# Patient Record
Sex: Female | Born: 1965
Health system: Southern US, Community
[De-identification: ages and names within clinical notes are randomized; demographics above are authoritative.]

## PROBLEM LIST (undated history)

## (undated) DIAGNOSIS — L709 Acne, unspecified: Secondary | ICD-10-CM

## (undated) DIAGNOSIS — G4730 Sleep apnea, unspecified: Secondary | ICD-10-CM

## (undated) DIAGNOSIS — I5181 Takotsubo syndrome: Secondary | ICD-10-CM

## (undated) DIAGNOSIS — F32A Depression, unspecified: Secondary | ICD-10-CM

## (undated) DIAGNOSIS — I10 Essential (primary) hypertension: Secondary | ICD-10-CM

## (undated) DIAGNOSIS — F329 Major depressive disorder, single episode, unspecified: Secondary | ICD-10-CM

## (undated) DIAGNOSIS — F419 Anxiety disorder, unspecified: Secondary | ICD-10-CM

## (undated) DIAGNOSIS — K921 Melena: Secondary | ICD-10-CM

## (undated) DIAGNOSIS — M1612 Unilateral primary osteoarthritis, left hip: Secondary | ICD-10-CM

## (undated) HISTORY — DX: Melena: K92.1

---

## 2008-01-07 ENCOUNTER — Encounter: Admission: RE | Admit: 2008-01-07 | Discharge: 2008-01-07 | Payer: Self-pay | Admitting: Orthopedic Surgery

## 2014-09-21 HISTORY — PX: COLONOSCOPY: SHX5424

## 2016-02-25 HISTORY — PX: CYSTECTOMY: SUR359

## 2018-01-19 ENCOUNTER — Inpatient Hospital Stay (HOSPITAL_COMMUNITY)
Admission: AD | Admit: 2018-01-19 | Discharge: 2018-01-20 | DRG: 281 | Disposition: A | Discharge status: Home / Self Care | Payer: BC Managed Care – PPO | Source: Other Acute Inpatient Hospital | Attending: Cardiology | Admitting: Cardiology

## 2018-01-19 ENCOUNTER — Other Ambulatory Visit: Payer: Self-pay

## 2018-01-19 ENCOUNTER — Encounter (HOSPITAL_COMMUNITY): Payer: Self-pay

## 2018-01-19 ENCOUNTER — Encounter (HOSPITAL_COMMUNITY)
Admission: AD | Disposition: A | Discharge status: Home / Self Care | Payer: Self-pay | Source: Other Acute Inpatient Hospital | Attending: Cardiology

## 2018-01-19 ENCOUNTER — Inpatient Hospital Stay (HOSPITAL_COMMUNITY): Payer: BC Managed Care – PPO

## 2018-01-19 DIAGNOSIS — I2510 Atherosclerotic heart disease of native coronary artery without angina pectoris: Secondary | ICD-10-CM | POA: Diagnosis present

## 2018-01-19 DIAGNOSIS — F329 Major depressive disorder, single episode, unspecified: Secondary | ICD-10-CM | POA: Diagnosis present

## 2018-01-19 DIAGNOSIS — F32A Depression, unspecified: Secondary | ICD-10-CM

## 2018-01-19 DIAGNOSIS — Z792 Long term (current) use of antibiotics: Secondary | ICD-10-CM

## 2018-01-19 DIAGNOSIS — R778 Other specified abnormalities of plasma proteins: Secondary | ICD-10-CM | POA: Diagnosis present

## 2018-01-19 DIAGNOSIS — Z79899 Other long term (current) drug therapy: Secondary | ICD-10-CM | POA: Diagnosis not present

## 2018-01-19 DIAGNOSIS — F419 Anxiety disorder, unspecified: Secondary | ICD-10-CM | POA: Diagnosis present

## 2018-01-19 DIAGNOSIS — I214 Non-ST elevation (NSTEMI) myocardial infarction: Secondary | ICD-10-CM | POA: Diagnosis present

## 2018-01-19 DIAGNOSIS — R55 Syncope and collapse: Secondary | ICD-10-CM | POA: Diagnosis not present

## 2018-01-19 DIAGNOSIS — L709 Acne, unspecified: Secondary | ICD-10-CM | POA: Diagnosis present

## 2018-01-19 DIAGNOSIS — I5181 Takotsubo syndrome: Secondary | ICD-10-CM | POA: Diagnosis present

## 2018-01-19 DIAGNOSIS — R0789 Other chest pain: Secondary | ICD-10-CM | POA: Diagnosis present

## 2018-01-19 DIAGNOSIS — R7989 Other specified abnormal findings of blood chemistry: Secondary | ICD-10-CM | POA: Diagnosis present

## 2018-01-19 HISTORY — DX: Acne, unspecified: L70.9

## 2018-01-19 HISTORY — DX: Takotsubo syndrome: I51.81

## 2018-01-19 HISTORY — DX: Depression, unspecified: F32.A

## 2018-01-19 HISTORY — DX: Anxiety disorder, unspecified: F41.9

## 2018-01-19 HISTORY — DX: Major depressive disorder, single episode, unspecified: F32.9

## 2018-01-19 HISTORY — PX: LEFT HEART CATH AND CORONARY ANGIOGRAPHY: CATH118249

## 2018-01-19 LAB — COMPREHENSIVE METABOLIC PANEL
ALT: 22 U/L (ref 0–44)
AST: 26 U/L (ref 15–41)
Albumin: 4.4 g/dL (ref 3.5–5.0)
Alkaline Phosphatase: 39 U/L (ref 38–126)
Anion gap: 7 (ref 5–15)
BUN: 12 mg/dL (ref 6–20)
CO2: 24 mmol/L (ref 22–32)
Calcium: 9.2 mg/dL (ref 8.9–10.3)
Chloride: 107 mmol/L (ref 98–111)
Creatinine, Ser: 0.85 mg/dL (ref 0.44–1.00)
GFR calc Af Amer: >60 mL/min (ref >60)
GFR calc non Af Amer: >60 mL/min (ref >60)
Glucose, Bld: 102 mg/dL — ABNORMAL HIGH (ref 70–99)
Potassium: 4.1 mmol/L (ref 3.5–5.1)
Sodium: 138 mmol/L (ref 135–145)
Total Bilirubin: 0.8 mg/dL (ref 0.3–1.2)
Total Protein: 7.3 g/dL (ref 6.5–8.1)

## 2018-01-19 LAB — LIPID PANEL
CHOLESTEROL: 158 mg/dL (ref 0–200)
HDL: 56 mg/dL (ref >40)
LDL CALC: 94 mg/dL (ref 0–99)
Total CHOL/HDL Ratio: 2.8 RATIO
Triglycerides: 39 mg/dL (ref <150)
VLDL: 8 mg/dL (ref 0–40)

## 2018-01-19 LAB — ECHOCARDIOGRAM COMPLETE
Height: 62 in
Weight: 2328.06 oz

## 2018-01-19 LAB — TROPONIN I: Troponin I: 1.1 ng/mL (ref <0.03)

## 2018-01-19 LAB — CBC WITH DIFFERENTIAL/PLATELET
Abs Immature Granulocytes: 0.04 10*3/uL (ref 0.00–0.07)
Basophils Absolute: 0 10*3/uL (ref 0.0–0.1)
Basophils Relative: 0 %
Eosinophils Absolute: 0.1 10*3/uL (ref 0.0–0.5)
Eosinophils Relative: 1 %
HCT: 43.1 % (ref 36.0–46.0)
Hemoglobin: 14.1 g/dL (ref 12.0–15.0)
Immature Granulocytes: 0 %
Lymphocytes Relative: 15 %
Lymphs Abs: 1.5 10*3/uL (ref 0.7–4.0)
MCH: 29.3 pg (ref 26.0–34.0)
MCHC: 32.7 g/dL (ref 30.0–36.0)
MCV: 89.4 fL (ref 80.0–100.0)
Monocytes Absolute: 0.5 10*3/uL (ref 0.1–1.0)
Monocytes Relative: 5 %
Neutro Abs: 8 10*3/uL — ABNORMAL HIGH (ref 1.7–7.7)
Neutrophils Relative %: 79 %
Platelets: 359 10*3/uL (ref 150–400)
RBC: 4.82 MIL/uL (ref 3.87–5.11)
RDW: 11.9 % (ref 11.5–15.5)
WBC: 10.1 10*3/uL (ref 4.0–10.5)
nRBC: 0 % (ref 0.0–0.2)

## 2018-01-19 LAB — APTT: aPTT: 102 seconds — ABNORMAL HIGH (ref 24–36)

## 2018-01-19 LAB — HEMOGLOBIN A1C
Hgb A1c MFr Bld: 4.6 % — ABNORMAL LOW (ref 4.8–5.6)
Mean Plasma Glucose: 85.32 mg/dL

## 2018-01-19 LAB — HIV ANTIBODY (ROUTINE TESTING W REFLEX): HIV Screen 4th Generation wRfx: NONREACTIVE

## 2018-01-19 LAB — MRSA PCR SCREENING: MRSA by PCR: NEGATIVE

## 2018-01-19 LAB — PROTIME-INR
INR: 1.03
Prothrombin Time: 13.4 seconds (ref 11.4–15.2)

## 2018-01-19 SURGERY — LEFT HEART CATH AND CORONARY ANGIOGRAPHY
Anesthesia: LOCAL

## 2018-01-19 MED ORDER — SODIUM CHLORIDE 0.9 % WEIGHT BASED INFUSION: 1.0000 mL/kg/h | INTRAVENOUS | Status: DC

## 2018-01-19 MED ORDER — SODIUM CHLORIDE 0.9% FLUSH: 3.0000 mL | Freq: Two times a day (BID) | INTRAVENOUS | Status: DC

## 2018-01-19 MED ORDER — ATORVASTATIN CALCIUM 80 MG PO TABS
80.0000 mg | ORAL_TABLET | Freq: Every day | ORAL | Status: DC
  Administered 2018-01-19: 80 mg via ORAL
  Filled 2018-01-19: qty 1

## 2018-01-19 MED ORDER — HEPARIN SODIUM (PORCINE) 1000 UNIT/ML IJ SOLN
2000.00 | INTRAMUSCULAR | Status: DC
Start: ? — End: 2018-01-19

## 2018-01-19 MED ORDER — DOXYCYCLINE HYCLATE 100 MG PO TABS
100.0000 mg | ORAL_TABLET | Freq: Two times a day (BID) | ORAL | Status: DC
  Administered 2018-01-19 – 2018-01-20 (×2): 100 mg via ORAL
  Filled 2018-01-19 (×3): qty 1

## 2018-01-19 MED ORDER — HEPARIN (PORCINE) IN NACL 25000-0.45 UT/250ML-% IV SOLN
12.00 | INTRAVENOUS | Status: DC
Start: ? — End: 2018-01-19

## 2018-01-19 MED ORDER — CARVEDILOL 6.25 MG PO TABS
6.2500 mg | ORAL_TABLET | Freq: Two times a day (BID) | ORAL | Status: DC
  Administered 2018-01-19: 6.25 mg via ORAL
  Filled 2018-01-19: qty 1

## 2018-01-19 MED ORDER — FENTANYL CITRATE (PF) 100 MCG/2ML IJ SOLN
INTRAMUSCULAR | Status: DC | PRN
  Administered 2018-01-19: 25 ug via INTRAVENOUS

## 2018-01-19 MED ORDER — MIDAZOLAM HCL 2 MG/2ML IJ SOLN
INTRAMUSCULAR | Status: AC
  Filled 2018-01-19: qty 2

## 2018-01-19 MED ORDER — SODIUM CHLORIDE 0.9 % WEIGHT BASED INFUSION: 3.0000 mL/kg/h | INTRAVENOUS | Status: DC

## 2018-01-19 MED ORDER — HEPARIN (PORCINE) IN NACL 1000-0.9 UT/500ML-% IV SOLN
INTRAVENOUS | Status: DC | PRN
  Administered 2018-01-19 (×2): 500 mL

## 2018-01-19 MED ORDER — ASPIRIN EC 81 MG PO TBEC
81.0000 mg | DELAYED_RELEASE_TABLET | Freq: Every day | ORAL | Status: DC
  Administered 2018-01-19 – 2018-01-20 (×2): 81 mg via ORAL
  Filled 2018-01-19: qty 1

## 2018-01-19 MED ORDER — ONDANSETRON HCL 4 MG/2ML IJ SOLN: 4.0000 mg | Freq: Four times a day (QID) | INTRAMUSCULAR | Status: DC | PRN

## 2018-01-19 MED ORDER — ASPIRIN 81 MG PO CHEW: 81.0000 mg | CHEWABLE_TABLET | ORAL | Status: AC

## 2018-01-19 MED ORDER — LOSARTAN POTASSIUM 25 MG PO TABS
12.5000 mg | ORAL_TABLET | Freq: Every day | ORAL | Status: DC
  Administered 2018-01-19 – 2018-01-20 (×2): 12.5 mg via ORAL
  Filled 2018-01-19 (×2): qty 1

## 2018-01-19 MED ORDER — VERAPAMIL HCL 2.5 MG/ML IV SOLN
INTRAVENOUS | Status: DC | PRN
  Administered 2018-01-19: 10 mL via INTRA_ARTERIAL

## 2018-01-19 MED ORDER — VENLAFAXINE HCL 50 MG PO TABS
50.0000 mg | ORAL_TABLET | Freq: Two times a day (BID) | ORAL | Status: DC
  Administered 2018-01-19 – 2018-01-20 (×2): 50 mg via ORAL
  Filled 2018-01-19 (×4): qty 1

## 2018-01-19 MED ORDER — ASPIRIN EC 81 MG PO TBEC
81.0000 mg | DELAYED_RELEASE_TABLET | Freq: Every day | ORAL | Status: DC
  Filled 2018-01-19: qty 1

## 2018-01-19 MED ORDER — BUPROPION HCL ER (SR) 100 MG PO TB12
100.0000 mg | ORAL_TABLET | Freq: Two times a day (BID) | ORAL | Status: DC
  Administered 2018-01-19: 100 mg via ORAL
  Filled 2018-01-19 (×3): qty 1

## 2018-01-19 MED ORDER — LIDOCAINE HCL (PF) 1 % IJ SOLN
INTRAMUSCULAR | Status: AC
  Filled 2018-01-19: qty 30

## 2018-01-19 MED ORDER — SODIUM CHLORIDE 0.9 % IV SOLN: 250.0000 mL | INTRAVENOUS | Status: DC | PRN

## 2018-01-19 MED ORDER — FENTANYL CITRATE (PF) 100 MCG/2ML IJ SOLN
INTRAMUSCULAR | Status: AC
  Filled 2018-01-19: qty 2

## 2018-01-19 MED ORDER — SODIUM CHLORIDE 0.9 % WEIGHT BASED INFUSION
3.0000 mL/kg/h | INTRAVENOUS | Status: DC
  Administered 2018-01-19: 3 mL/kg/h via INTRAVENOUS

## 2018-01-19 MED ORDER — ACETAMINOPHEN 325 MG PO TABS
650.0000 mg | ORAL_TABLET | ORAL | Status: DC | PRN
  Administered 2018-01-19: 650 mg via ORAL
  Filled 2018-01-19: qty 2

## 2018-01-19 MED ORDER — SODIUM CHLORIDE 0.9% FLUSH
3.0000 mL | Freq: Two times a day (BID) | INTRAVENOUS | Status: DC
  Administered 2018-01-20: 3 mL via INTRAVENOUS

## 2018-01-19 MED ORDER — HEPARIN (PORCINE) IN NACL 1000-0.9 UT/500ML-% IV SOLN
INTRAVENOUS | Status: AC
  Filled 2018-01-19: qty 500

## 2018-01-19 MED ORDER — SODIUM CHLORIDE 0.9% FLUSH: 3.0000 mL | INTRAVENOUS | Status: DC | PRN

## 2018-01-19 MED ORDER — MIDAZOLAM HCL 2 MG/2ML IJ SOLN
INTRAMUSCULAR | Status: DC | PRN
  Administered 2018-01-19: 2 mg via INTRAVENOUS

## 2018-01-19 MED ORDER — ASPIRIN 81 MG PO CHEW: 81.0000 mg | CHEWABLE_TABLET | ORAL | Status: DC

## 2018-01-19 MED ORDER — NITROGLYCERIN 0.4 MG SL SUBL: 0.4000 mg | SUBLINGUAL_TABLET | SUBLINGUAL | Status: DC | PRN

## 2018-01-19 MED ORDER — VERAPAMIL HCL 2.5 MG/ML IV SOLN
INTRAVENOUS | Status: AC
  Filled 2018-01-19: qty 2

## 2018-01-19 MED ORDER — LORAZEPAM 0.5 MG PO TABS
0.5000 mg | ORAL_TABLET | Freq: Four times a day (QID) | ORAL | Status: DC | PRN
  Administered 2018-01-19 – 2018-01-20 (×3): 0.5 mg via ORAL
  Filled 2018-01-19 (×3): qty 1

## 2018-01-19 MED ORDER — SODIUM CHLORIDE 0.9% FLUSH
3.0000 mL | INTRAVENOUS | Status: DC | PRN
  Administered 2018-01-19: 3 mL via INTRAVENOUS
  Filled 2018-01-19: qty 3

## 2018-01-19 MED ORDER — SODIUM CHLORIDE 0.9 % IV SOLN: INTRAVENOUS | Status: AC

## 2018-01-19 MED ORDER — HEPARIN (PORCINE) 25000 UT/250ML-% IV SOLN: 800.0000 [IU]/h | INTRAVENOUS | Status: DC

## 2018-01-19 MED ORDER — LIDOCAINE HCL (PF) 1 % IJ SOLN
INTRAMUSCULAR | Status: DC | PRN
  Administered 2018-01-19: 2 mL

## 2018-01-19 MED ORDER — HEPARIN SODIUM (PORCINE) 1000 UNIT/ML IJ SOLN
INTRAMUSCULAR | Status: DC | PRN
  Administered 2018-01-19: 3500 [IU] via INTRAVENOUS

## 2018-01-19 MED ORDER — IOHEXOL 350 MG/ML SOLN
INTRAVENOUS | Status: DC | PRN
  Administered 2018-01-19: 80 mL via INTRAVENOUS

## 2018-01-19 SURGICAL SUPPLY — 13 items
CATH INFINITI 5FR ANG PIGTAIL (CATHETERS) ×2 IMPLANT
CATH OPTITORQUE TIG 4.0 5F (CATHETERS) ×2 IMPLANT
DEVICE RAD COMP TR BAND LRG (VASCULAR PRODUCTS) ×2 IMPLANT
GLIDESHEATH SLEND A-KIT 6F 22G (SHEATH) ×2 IMPLANT
GUIDEWIRE INQWIRE 1.5J.035X260 (WIRE) ×1 IMPLANT
INQWIRE 1.5J .035X260CM (WIRE) ×2
KIT HEART LEFT (KITS) ×2 IMPLANT
PACK CARDIAC CATHETERIZATION (CUSTOM PROCEDURE TRAY) ×2 IMPLANT
SHEATH PROBE COVER 6X72 (BAG) ×2 IMPLANT
SYR MEDRAD MARK 7 150ML (SYRINGE) ×2 IMPLANT
TRANSDUCER W/STOPCOCK (MISCELLANEOUS) ×2 IMPLANT
TUBING CIL FLEX 10 FLL-RA (TUBING) ×2 IMPLANT
WIRE HI TORQ VERSACORE-J 145CM (WIRE) ×2 IMPLANT

## 2018-01-19 NOTE — Progress Notes (Signed)
 Progress Note  Patient Name: Alexis Ibarra Date of Encounter: 01/19/2018  Primary Cardiologist: new to Alexis Ibarra   Subjective   52-year-old female with no significant past medical history presented with episodes of chest pain and nausea.  Troponin levels are elevated.  Originally presented to [facility] emergency room and was transferred to [place].  Is now ruled in for non-ST segment elevation myocardial infarction. She is a non-smoker.  Inpatient Medications    Scheduled Meds: . aspirin  81 mg Oral Pre-Cath  . aspirin EC  81 mg Oral Daily  . atorvastatin  80 mg Oral q1800  . buPROPion  100 mg Oral BID  . doxycycline  100 mg Oral Q12H  . sodium chloride flush  3 mL Intravenous Q12H  . venlafaxine  50 mg Oral BID WC   Continuous Infusions: . sodium chloride    . sodium chloride     Followed by  . sodium chloride    . heparin 800 Units/hr (01/19/18 0400)   PRN Meds: sodium chloride, acetaminophen, LORazepam, nitroGLYCERIN, ondansetron (ZOFRAN) IV, sodium chloride flush   Vital Signs    Vitals:   01/19/18 0139 01/19/18 0140 01/19/18 0200 01/19/18 0455  BP: 116/71     Pulse: (!) 55     Resp: 18     Temp: 98.7 F (37.1 C)   98.2 F (36.8 C)  TempSrc: Oral   Oral  SpO2: 98%     Weight: 65 kg 65 kg 66 kg   Height: 5' 2" (1.575 m) 5' 2" (1.575 m) 5' 2" (1.575 m)     Intake/Output Summary (Last 24 hours) at 01/19/2018 0822 Last data filed at 01/19/2018 0400 Gross per 24 hour  Intake 18.63 ml  Output -  Net 18.63 ml   Filed Weights   01/19/18 0139 01/19/18 0140 01/19/18 0200  Weight: 65 kg 65 kg 66 kg    Telemetry    NSR - Personally Reviewed  ECG     NSR,  NS ST /T wave changes in I and aVL   - Personally Reviewed  Physical Exam    GEN:  Young female, no acute distress.  Very emotional this morning..   Neck: No JVD Cardiac: RRR, significant murmur. Respiratory: Clear to auscultation bilaterally. GI: Soft, nontender, non-distended  MS:  Right radial  pulse is good.  Right femoral pulse is good. Neuro:  Nonfocal  Psych: Normal affect   Labs    Chemistry Recent Labs  Lab 01/19/18 0255  NA 138  K 4.1  CL 107  CO2 24  GLUCOSE 102*  BUN 12  CREATININE 0.85  CALCIUM 9.2  PROT 7.3  ALBUMIN 4.4  AST 26  ALT 22  ALKPHOS 39  BILITOT 0.8  GFRNONAA >60  GFRAA >60  ANIONGAP 7     Hematology Recent Labs  Lab 01/19/18 0255  WBC 10.1  RBC 4.82  HGB 14.1  HCT 43.1  MCV 89.4  MCH 29.3  MCHC 32.7  RDW 11.9  PLT 359    Cardiac Enzymes Recent Labs  Lab 01/19/18 0255  TROPONINI 1.10*   No results for input(s): TROPIPOC in the last 168 hours.   BNPNo results for input(s): BNP, PROBNP in the last 168 hours.   DDimer No results for input(s): DDIMER in the last 168 hours.   Radiology    No results found.  Cardiac Studies     Patient Profile     52 y.o. female resented with a non-ST segment elevation myocardial   infarction.  He was transferred to [facility] for further evaluation.  Assessment & Plan    1.  Coronary artery disease: Alexis Ibarra presented to [facility] emergency room with chest pain, nausea and vomiting.  She had a troponin level of 0.3.  She was transferred to [facility].  This morning her troponin level is 1.1. EKG shows ST and T wave changes in the lateral leads.  Is currently pain-free.  She is not had any arrhythmias.  We discussed heart catheterization. We discussed the risks, benefits, options concerning heart catheterization.  She understands and agrees to proceed. We will check fasting lipids today.   For questions or updates, please contact [facility] Please consult [email or web address] for contact info under        Signed, Alexis Riedlinger, MD  01/19/2018, 8:22 AM    

## 2018-01-19 NOTE — Progress Notes (Signed)
TR Band removed , gauze2x2 and tegaderm applied. No bleeding noted. Continue to monitor.

## 2018-01-19 NOTE — Plan of Care (Signed)

## 2018-01-19 NOTE — Progress Notes (Signed)
Transported to the cath. Lab by bed. 

## 2018-01-19 NOTE — Progress Notes (Signed)
Back from the cath. Lab by bed awake and alert. TR band to right wrist intact. Instructed to avoid moving right arm, elevated  With pillow, pulse ox to right thumb . Continue to monitor.

## 2018-01-19 NOTE — Progress Notes (Signed)
ANTICOAGULATION CONSULT NOTE - Initial Consult  Pharmacy Consult for Heparin Indication: chest pain/ACS  Allergies not on file  Patient Measurements: Height: 5\' 2"  (157.5 cm) Weight: 145 lb 8.1 oz (66 kg) IBW/kg (Calculated) : 50.1 Heparin Dosing Weight: 65 kg   Vital Signs:    Labs: No results for input(s): HGB, HCT, PLT, APTT, LABPROT, INR, HEPARINUNFRC, HEPRLOWMOCWT, CREATININE, CKTOTAL, CKMB, TROPONINI in the last 72 hours.  CrCl cannot be calculated (No successful lab value found.).   Medical History: No past medical history on file.  Medications:  Wellbutrin  Effexor  Doxycycline  Assessment: 52 y.o. female with chest pain for heparin.  Heparin 4000 units IV bolus and 790 units/hr started at [facility] at 2320  Goal of Therapy:  Heparin level 0.3-0.7 units/ml Monitor platelets by anticoagulation protocol: Yes   Plan:  Continue Heparin 800 units/hr Follow-up am labs.   Alexis Ibarra, Alexis Ibarra 01/19/2018,2:49 AM

## 2018-01-19 NOTE — Progress Notes (Signed)
  Echocardiogram 2D Echocardiogram has been performed.  Patient unable to turn in LLD position.   Alexis Ibarra 01/19/2018, 1:04 PM

## 2018-01-19 NOTE — Progress Notes (Signed)
    Pt has takotsubo syndrome Minimal CAD   Will start Coreg 3.125 BID Losartan 12.5 mg a day ( BP is marginal )   Home tomorrow    Kristeen MissPhilip Jolane Bankhead, MD  01/19/2018 2:16 PM    [facility] [address],  [address] [place], [place]  [address] Pager [phone] Phone: [phone]; Fax: [phone]

## 2018-01-19 NOTE — H&P (Addendum)
Cardiology History & Physical    Patient ID: Alexis Ibarra MRN: [id number], DOB: December 17, 1965 Date of Encounter: 01/19/2018, 2:17 AM Primary Physician: No primary care provider on file.  Chief Complaint: Chest pain  HPI: Alexis Ibarra is a 52 y.o. female with no significant past medical history who presents after an episode of chest pain.  The patient was in her usual state of health, until this evening after dinner, when she noted the onset of severe substernal chest pain described as pressure while at rest.  The pain was accompanied by numbness and tingling down bilateral arms.  She also had an episode of nonbloody emesis while in route to the hospital.  She presented to the [facility] ED, where her vital signs were within normal limits and EKG showed no acute ST segment changes.  Her labs are notable for initial troponin of 0.3, and were otherwise normal.  She was given full dose aspirin, started on IV heparin and admitted to [facility] for further management.  Her chest pain resolved while in the ED, and has not recurred since.  Of note, she denies any smoking history, family history of heart disease, history of hypertension, hyperlipidemia or diabetes.  PMH: Anxiety/depression Acne  Home Meds: Venlafaxine 50 mg twice daily Bupropion 100 mg twice daily Doxycycline 100 mg BID  Allergies: Allergies not on file  Social History   Socioeconomic History  . Marital status: Married    Spouse name: Not on file  . Number of children: Not on file  . Years of education: Not on file  . Highest education level: Not on file  Occupational History  . Not on file  Social Needs  . Financial resource strain: Not on file  . Food insecurity:    Worry: Not on file    Inability: Not on file  . Transportation needs:    Medical: Not on file    Non-medical: Not on file  Tobacco Use  . Smoking status: Not on file  Substance and Sexual Activity  . Alcohol use: Not on file  . Drug use: Not on file    . Sexual activity: Not on file  Lifestyle  . Physical activity:    Days per week: Not on file    Minutes per session: Not on file  . Stress: Not on file  Relationships  . Social connections:    Talks on phone: Not on file    Gets together: Not on file    Attends religious service: Not on file    Active member of club or organization: Not on file    Attends meetings of clubs or organizations: Not on file    Relationship status: Not on file  . Intimate partner violence:    Fear of current or ex partner: Not on file    Emotionally abused: Not on file    Physically abused: Not on file    Forced sexual activity: Not on file  Other Topics Concern  . Not on file  Social History Narrative  . Not on file     No family history on file.  Review of Systems: All other systems reviewed and are otherwise negative except as noted above.  Labs:  No results found for: WBC, HGB, HCT, MCV, PLT No results for input(s): NA, K, CL, CO2, BUN, CREATININE, CALCIUM, PROT, BILITOT, ALKPHOS, ALT, AST, GLUCOSE in the last 168 hours.  Invalid input(s): LABALBU No results for input(s): CKTOTAL, CKMB, TROPONINI in the last 72 hours. No  results found for: CHOL, HDL, LDLCALC, TRIG No results found for: DDIMER  Radiology/Studies:  No results found. Wt Readings from Last 3 Encounters:  No data found for Wt    EKG: NSR, no acute ST segment changes.  Physical Exam: There were no vitals taken for this visit. There is no height or weight on file to calculate BMI. General: Well developed, well nourished, in no acute distress. Head: Normocephalic, atraumatic, sclera non-icteric, no xanthomas, nares are without discharge.  Neck: Negative for carotid bruits. JVD not elevated. Lungs: Clear bilaterally to auscultation without wheezes, rales, or rhonchi. Breathing is unlabored. Heart: RRR with S1 S2. No murmurs, rubs, or gallops appreciated. Abdomen: Soft, non-tender, non-distended with normoactive bowel  sounds. No hepatomegaly. No rebound/guarding. No obvious abdominal masses. Msk:  Strength and tone appear normal for age. Extremities: No clubbing or cyanosis. No edema.  Distal pedal pulses are 2+ and equal bilaterally. Neuro: Alert and oriented X 3. No focal deficit. No facial asymmetry. Moves all extremities spontaneously. Psych:  Responds to questions appropriately with a normal affect.    Assessment and Plan  52 year old lady with no significant medical history who presents with non-STEMI.  1.  NSTEMI: Chest pain-free on exam.  Continue IV heparin and anticipate cardiac catheterization in the morning.  Echocardiogram ordered.  Will start aspirin 81 mg daily and high intensity atorvastatin.  2.  Depression: Continue home venlafaxine and bupropion.  3.  Acne: Continue home doxycycline.  Signed, Esmond PlantsJaidip Kunal Levario, MD 01/19/2018, 2:17 AM

## 2018-01-19 NOTE — Interval H&P Note (Signed)
History and Physical Interval Note:  01/19/2018 9:26 AM  Alexis Ibarra  has presented today for surgery, with the diagnosis of NSTEMI The various methods of treatment have been discussed with the patient and family. After consideration of risks, benefits and other options for treatment, the patient has consented to  Procedure(s): LEFT HEART CATH AND CORONARY ANGIOGRAPHY (N/A) - with possible PERCUTANEOUS CORONARY INTERVENTION as a surgical intervention .  The patient's history has been reviewed, patient examined, no change in status, stable for surgery.  I have reviewed the patient's chart and labs.  Questions were answered to the patient's satisfaction.    Cath Lab Visit (complete for each Cath Lab visit)  Clinical Evaluation Leading to the Procedure:   ACS: Yes.    Non-ACS:    Anginal Classification: CCS III  Anti-ischemic medical therapy: Minimal Therapy (1 class of medications)  Non-Invasive Test Results: No non-invasive testing performed  Prior CABG: No previous CABG   Bryan Lemmaavid 

## 2018-01-19 NOTE — H&P (View-Only) (Signed)
Progress Note  Patient Name: Alexis Ibarra Date of Encounter: 01/19/2018  Primary Cardiologist: new to Alexis Ibarra   Subjective   52 year old female with no significant past medical history presented with episodes of chest pain and nausea.  Troponin levels are elevated.  Originally presented to [facility] emergency room and was transferred to [facility].  Is now ruled in for non-ST segment elevation myocardial infarction. She is a non-smoker.  Inpatient Medications    Scheduled Meds: . aspirin  81 mg Oral Pre-Cath  . aspirin EC  81 mg Oral Daily  . atorvastatin  80 mg Oral q1800  . buPROPion  100 mg Oral BID  . doxycycline  100 mg Oral Q12H  . sodium chloride flush  3 mL Intravenous Q12H  . venlafaxine  50 mg Oral BID WC   Continuous Infusions: . sodium chloride    . sodium chloride     Followed by  . sodium chloride    . heparin 800 Units/hr (01/19/18 0400)   PRN Meds: sodium chloride, acetaminophen, LORazepam, nitroGLYCERIN, ondansetron (ZOFRAN) IV, sodium chloride flush   Vital Signs    Vitals:   01/19/18 0139 01/19/18 0140 01/19/18 0200 01/19/18 0455  BP: 116/71     Pulse: (!) 55     Resp: 18     Temp: 98.7 F (37.1 C)   98.2 F (36.8 C)  TempSrc: Oral   Oral  SpO2: 98%     Weight: 65 kg 65 kg 66 kg   Height: 5\' 2"  (1.575 m) 5\' 2"  (1.575 m) 5\' 2"  (1.575 m)     Intake/Output Summary (Last 24 hours) at 01/19/2018 1610 Last data filed at 01/19/2018 0400 Gross per 24 hour  Intake 18.63 ml  Output -  Net 18.63 ml   Filed Weights   01/19/18 0139 01/19/18 0140 01/19/18 0200  Weight: 65 kg 65 kg 66 kg    Telemetry    NSR - Personally Reviewed  ECG     NSR,  NS ST /T wave changes in I and aVL   - Personally Reviewed  Physical Exam    GEN:  Young female, no acute distress.  Very emotional this morning..   Neck: No JVD Cardiac: RRR, significant murmur. Respiratory: Clear to auscultation bilaterally. GI: Soft, nontender, non-distended  MS:  Right radial  pulse is good.  Right femoral pulse is good. Neuro:  Nonfocal  Psych: Normal affect   Labs    Chemistry Recent Labs  Lab 01/19/18 0255  NA 138  K 4.1  CL 107  CO2 24  GLUCOSE 102*  BUN 12  CREATININE 0.85  CALCIUM 9.2  PROT 7.3  ALBUMIN 4.4  AST 26  ALT 22  ALKPHOS 39  BILITOT 0.8  GFRNONAA >60  GFRAA >60  ANIONGAP 7     Hematology Recent Labs  Lab 01/19/18 0255  WBC 10.1  RBC 4.82  HGB 14.1  HCT 43.1  MCV 89.4  MCH 29.3  MCHC 32.7  RDW 11.9  PLT 359    Cardiac Enzymes Recent Labs  Lab 01/19/18 0255  TROPONINI 1.10*   No results for input(s): TROPIPOC in the last 168 hours.   BNPNo results for input(s): BNP, PROBNP in the last 168 hours.   DDimer No results for input(s): DDIMER in the last 168 hours.   Radiology    No results found.  Cardiac Studies     Patient Profile     52 y.o. female resented with a non-ST segment elevation myocardial  infarction.  [facility].  Assessment & Plan    1.  Coronary artery disease: Alexis Ibarra presented [facility] with chest pain, nausea and vomiting.  She had a troponin level of 0.3.  [facility] was [facility].  This morning her troponin level is 1.1. EKG shows ST and T wave changes in the lateral leads.  Is currently pain-free.  She is not had any arrhythmias.  We discussed heart catheterization. We discussed the risks, benefits, options concerning heart catheterization.  She understands and agrees to proceed. We will check fasting lipids today.   For questions or updates, please contact [facility] Please consult [email or web address] for contact info under        Signed, Kristeen MissPhilip Carla Rashad, MD  01/19/2018, 8:22 AM

## 2018-01-20 ENCOUNTER — Encounter (HOSPITAL_COMMUNITY): Payer: Self-pay | Admitting: Physician Assistant

## 2018-01-20 DIAGNOSIS — F32A Depression, unspecified: Secondary | ICD-10-CM

## 2018-01-20 DIAGNOSIS — I5181 Takotsubo syndrome: Secondary | ICD-10-CM

## 2018-01-20 DIAGNOSIS — F329 Major depressive disorder, single episode, unspecified: Secondary | ICD-10-CM

## 2018-01-20 DIAGNOSIS — F419 Anxiety disorder, unspecified: Secondary | ICD-10-CM

## 2018-01-20 DIAGNOSIS — L709 Acne, unspecified: Secondary | ICD-10-CM

## 2018-01-20 LAB — BASIC METABOLIC PANEL
Anion gap: 6 (ref 5–15)
BUN: 9 mg/dL (ref 6–20)
CO2: 23 mmol/L (ref 22–32)
CREATININE: 0.87 mg/dL (ref 0.44–1.00)
Calcium: 8.6 mg/dL — ABNORMAL LOW (ref 8.9–10.3)
Chloride: 110 mmol/L (ref 98–111)
GFR calc Af Amer: >60 mL/min (ref >60)
Glucose, Bld: 77 mg/dL (ref 70–99)
Potassium: 4.4 mmol/L (ref 3.5–5.1)
SODIUM: 139 mmol/L (ref 135–145)

## 2018-01-20 LAB — CBC
HCT: 40.2 % (ref 36.0–46.0)
Hemoglobin: 13.4 g/dL (ref 12.0–15.0)
MCH: 30.4 pg (ref 26.0–34.0)
MCHC: 33.3 g/dL (ref 30.0–36.0)
MCV: 91.2 fL (ref 80.0–100.0)
NRBC: 0 % (ref 0.0–0.2)
PLATELETS: 269 10*3/uL (ref 150–400)
RBC: 4.41 MIL/uL (ref 3.87–5.11)
RDW: 12.2 % (ref 11.5–15.5)
WBC: 5 10*3/uL (ref 4.0–10.5)

## 2018-01-20 LAB — TROPONIN I: TROPONIN I: 0.37 ng/mL — AB (ref <0.03)

## 2018-01-20 MED ORDER — NITROGLYCERIN 0.4 MG SL SUBL: 0.4000 mg | SUBLINGUAL_TABLET | SUBLINGUAL | 3 refills | Status: DC | PRN

## 2018-01-20 MED ORDER — ATORVASTATIN CALCIUM 40 MG PO TABS: 40.0000 mg | ORAL_TABLET | Freq: Every day | ORAL | Status: DC

## 2018-01-20 MED ORDER — CARVEDILOL 3.125 MG PO TABS: 3.1250 mg | ORAL_TABLET | Freq: Two times a day (BID) | ORAL | 6 refills | Status: DC

## 2018-01-20 MED ORDER — LOSARTAN POTASSIUM 25 MG PO TABS: 12.5000 mg | ORAL_TABLET | Freq: Every day | ORAL | 6 refills | Status: DC

## 2018-01-20 MED ORDER — ASPIRIN 81 MG PO TBEC: 81.0000 mg | DELAYED_RELEASE_TABLET | Freq: Every day | ORAL | 6 refills | Status: DC

## 2018-01-20 MED ORDER — AMMONIA AROMATIC IN INHA
0.3000 mL | RESPIRATORY_TRACT | Status: DC
  Filled 2018-01-20: qty 10

## 2018-01-20 MED ORDER — CARVEDILOL 3.125 MG PO TABS
3.1250 mg | ORAL_TABLET | Freq: Two times a day (BID) | ORAL | Status: DC
  Administered 2018-01-20: 3.125 mg via ORAL
  Filled 2018-01-20: qty 1

## 2018-01-20 MED ORDER — ROSUVASTATIN CALCIUM 5 MG PO TABS: 5.0000 mg | ORAL_TABLET | Freq: Every day | ORAL | Status: DC

## 2018-01-20 MED ORDER — ROSUVASTATIN CALCIUM 5 MG PO TABS: 5.0000 mg | ORAL_TABLET | Freq: Every evening | ORAL | 6 refills | Status: DC

## 2018-01-20 MED FILL — CARVEDILOL 3.125 MG TABLET: 3.125 | 30 days supply | Qty: 60 | Fill #0

## 2018-01-20 MED FILL — ASPIRIN LOW DOSE 81 MG TBEC: 81 | 30 days supply | Qty: 30 | Fill #0

## 2018-01-20 MED FILL — NITROGLYCERIN 0.4 MG TAB SL: 0.4 | 8 days supply | Qty: 25 | Fill #0

## 2018-01-20 MED FILL — LOSARTAN POTASSIUM 25 MG TA: 25 | 30 days supply | Qty: 15 | Fill #0

## 2018-01-20 MED FILL — ROSUVASTATIN CALCIUM 5 MG T: 5 | 30 days supply | Qty: 30 | Fill #0

## 2018-01-20 NOTE — Progress Notes (Signed)
Paged by nurse regarding request for ammonia inhalent - pharmacy was nearby patient's room when patient had vagal event and nurse requested urgently. However, symptoms resolved before this was sent up from pharmacy. Patient has known phobia of having blood draws and even had to have ativan this admission to accomplish them due to h/o vagal reaction. Before her IV was taken out she looked down and saw blood had come through the gauze. She became pale and lightheaded. Nurse quickly tended to the IV issue and patient's color came back and she is feeling well now. Vital signs are stable. She's ambulated without complication and wants to go home. Proceed with dc with prior activity precautions in place per AVS. Ronie Spiesayna Dunn PA-C

## 2018-01-20 NOTE — Progress Notes (Signed)
Pt and spouse provided with verbal discharge instructions and given a paper copy. No further questions.  Pt provided with work note. Pt VSS discharge. IV removed per order. Pts belongings sent with patient. D/C by wheelchair through front entrance.

## 2018-01-20 NOTE — Discharge Summary (Addendum)
Discharge Summary    Patient ID: Alexis Ageina Mossberg,  MRN: [id number], DOB/AGE: 53-May-1967 53 y.o.  Admit date: 01/19/2018 Discharge date: 01/20/2018  Primary Care Provider: No primary care provider on file. Primary Cardiologist: Kristeen MissPhilip Nahser, MD Primary Electrophysiologist:  None  Discharge Diagnoses    Principal Problem:   Takotsubo cardiomyopathy Active Problems:   Elevated troponin   Anxiety   Depression   Acne    Diagnostic Studies/Procedures    LHC 01/19/18 Conclusion    RPDA lesion is 40% stenosed. Otherwise angiographically normal coronary arteries  The left ventricular ejection fraction is 55-65% by visual estimate.  LV end diastolic pressure is moderately elevated.  Apical ballooning with preserved EF.   SUMMARY  Angiographically mild RPDA disease otherwise normal coronaries but tortuous.  Left ventriculography suggestive of Takotsubo cardiomyopathy with preserved EF, apical ballooning  Moderately elevated LVEDP  RECOMMENDATION -medical management  TR band removal per protocol  Anticipate discharge tomorrow  No indication for antiplatelet therapy at this time.3   2D echo 01/19/18 Study Conclusions  - Left ventricle: The cavity size was normal. Systolic function was   normal. The estimated ejection fraction was in the range of 60%   to 65%. Wall motion was normal; there were no regional wall   motion abnormalities. Left ventricular diastolic function   parameters were normal. - Aortic valve: There was trivial regurgitation. Valve area (VTI):   1.76 cm^2. Valve area (Vmax): 1.68 cm^2. Valve area (Vmean): 1.84   cm^2.    _____________     History of Present Illness     Alexis Ibarra is a 52 y.o. female with history of anxiety, depression, acne (on home doxycycline) who was admitted with chest pain and elevated troponin (troponin 1.1).  Hospital Course    She has been under increased stress lately but is somewhat guarded explaining the  situation, having to do with her husband being more quiet the last few days and her father being sick. The evening of admission after dinner she developed severe substernal CP down her arms associated with numbness and tingling. [facility], where her vital signs were within normal limits and EKG showed no acute ST segment changes.  Her labs are notable for initial troponin of 0.3, and were otherwise normal.  She was given full dose aspirin, started on IV heparin and [facility]. Here, her troponin was 1.10. She underwent LHC showing mild RPDA disease (40%) but otherwise normal coronaries but tortuous. She did have Left ventriculography suggestive of Takotsubo cardiomyopathy with preserved EF, apical ballooning, likely accounting for her NSTEMI. She had moderately elevated LVEDP. She was started on ASA, low dose carvedilol, losartan. Dr. Elease HashimotoNahser recommended low dose Crestor 5mg  daily. The patient was somewhat concerned about side effects so this will need to be evaluated in follow-up. 2D echo showed EF 60-65%, normal diastolic function, trivial AI. This morning she feels well and ready to go home. Dr. Elease HashimotoNahser has seen and examined the patient today and feels she is stable for discharge. TOC follow-up has been arranged. Dr. Elease HashimotoNahser feels she can return to work on Monday 01/25/18. If the patient is tolerating statin at time of follow-up appointment, would consider rechecking liver function/lipid panel in 6-8 weeks. The patient was on a hormonal supplement prior to admission which we advised discontinuing for now. [facility] who will convey refills to her regular pharmacy after DC.  Suggest  BMET in follow-up given ACEI initiation. _____________  Discharge Vitals Blood pressure 116/66, pulse 75, temperature 98.2 F (36.8 C), temperature source Oral, resp. rate 13, height 5\' 2"  (1.575 m), weight 66 kg, SpO2 99 %.   Filed Weights   01/19/18 0139 01/19/18 0140 01/19/18 0200  Weight: 65 kg 65 kg 66 kg   Vital Signs. BP 116/66   Pulse 75   Temp 98.2 F (36.8 C) (Oral)   Resp 13   Ht 5\' 2"  (1.575 m)   Wt 66 kg   SpO2 99%   BMI 26.61 kg/m  General: Well developed, well nourished WF, in no acute distress. Head: Normocephalic, atraumatic, sclera non-icteric, no xanthomas, nares are without discharge. Neck: Negative for carotid bruits. JVP not elevated. Lungs: Clear bilaterally to auscultation without wheezes, rales, or rhonchi. Breathing is unlabored. Heart: RRR S1 S2 without murmurs, rubs, or gallops.  Abdomen: Soft, non-tender, non-distended with normoactive bowel sounds. No rebound/guarding. Extremities: No clubbing or cyanosis. No edema. Distal pedal pulses are 2+ and equal bilaterally. Right radial cath site without hematoma or ecchymosis; good pulse. Neuro: Alert and oriented X 3. Moves all extremities spontaneously. Psych:  Responds to questions appropriately with a normal affect.   Labs & Radiologic Studies    CBC Recent Labs    01/19/18 0255 01/20/18 0653  WBC 10.1 5.0  NEUTROABS 8.0*  --   HGB 14.1 13.4  HCT 43.1 40.2  MCV 89.4 91.2  PLT 359 269   Basic Metabolic Panel Recent Labs    16/10/96 0255 01/20/18 0840  NA 138 139  K 4.1 4.4  CL 107 110  CO2 24 23  GLUCOSE 102* 77  BUN 12 9  CREATININE 0.85 0.87  CALCIUM 9.2 8.6*   Liver Function Tests Recent Labs    01/19/18 0255  AST 26  ALT 22  ALKPHOS 39  BILITOT 0.8  PROT 7.3  ALBUMIN 4.4   Cardiac Enzymes Recent Labs    01/19/18 0255 01/20/18 0840  TROPONINI 1.10* 0.37*   BNP Invalid input(s): POCBNP D-Dimer No results for input(s): DDIMER in the last 72 hours. Hemoglobin A1C Recent Labs    01/19/18 0255  HGBA1C 4.6*   Fasting Lipid Panel Recent Labs    01/19/18 1047  CHOL 158  HDL 56  LDLCALC 94  TRIG 39  CHOLHDL 2.8  _____________  No results found. Disposition   Pt is being  discharged home today in good condition.  Follow-up Plans & Appointments    Follow-up Information    Nahser, Deloris Ping, MD Follow up.   Specialty:  Cardiology Why:  [facility] - 01/27/18 at 11:40am. Contact information: [address] [place] [address] [phone]          Discharge Instructions    Diet - low sodium heart healthy   Complete by:  As directed    Discharge instructions   Complete by:  As directed    Your medicine list included a hormonal supplement - would recommend you discontinue this medication for now given your heart event and discuss with your primary care or OBGYN.   Increase activity slowly   Complete by:  As directed    No driving for 5 days. No lifting over 10 lbs for 2 weeks. No sexual activity for 2 weeks. You may return to work on Monday 01/25/18. Keep procedure site clean & dry. If you notice increased pain, swelling, bleeding or pus, call/return!  You may shower, but no soaking  baths/hot tubs/pools for 1 week.      Discharge Medications   Allergies as of 01/20/2018   No Known Allergies     Medication List    STOP taking these medications   PRESCRIPTION MEDICATION     TAKE these medications   aspirin 81 MG EC tablet Take 1 tablet (81 mg total) by mouth daily.   carvedilol 3.125 MG tablet Commonly known as:  COREG Take 1 tablet (3.125 mg total) by mouth 2 (two) times daily with a meal.   doxycycline 20 MG tablet Commonly known as:  PERIOSTAT Take 20 mg by mouth 2 (two) times daily with a meal.   losartan 25 MG tablet Commonly known as:  COZAAR Take 0.5 tablets (12.5 mg total) by mouth daily.   nitroGLYCERIN 0.4 MG SL tablet Commonly known as:  NITROSTAT Place 1 tablet (0.4 mg total) under the tongue every 5 (five) minutes as needed for chest pain (up to 3 doses).   rosuvastatin 5 MG tablet Commonly known as:  CRESTOR Take 1 tablet (5 mg total) by mouth every evening.   venlafaxine 37.5 MG tablet Commonly  known as:  EFFEXOR Take 37.5 mg by mouth 2 (two) times daily.        Allergies:  No Known Allergies  Aspirin prescribed at discharge?  Yes High Intensity Statin Prescribed? (Lipitor 40-80mg  or Crestor 20-40mg ): No: minimal CAD - not classic NSTEMI - felt due to Takotsubo Beta Blocker Prescribed? Yes For EF <40%, was ACEI/ARB Prescribed? Yes ADP Receptor Inhibitor Prescribed? (i.e. Plavix etc.-Includes Medically Managed Patients): No: not classic NSTEMI - felt due to Takotsubno For EF <40%, Aldosterone Inhibitor Prescribed? No: n/a Was EF assessed during THIS hospitalization? Yes  Was Cardiac Rehab II ordered? (Included Medically managed Patients): No: minimal coronary disease on cath   Outstanding Labs/Studies   If the patient is tolerating statin at time of follow-up appointment, would consider rechecking liver function/lipid panel in 6-8 weeks.   Duration of Discharge Encounter   Greater than 30 minutes including physician time.  Signed, Laurann Montana PA-C 01/20/2018, 10:24 AM   Attending Note:   The patient was seen and examined.  Agree with assessment and plan as noted above.  Changes made to the above note as needed.  Patient seen and independently examined with  Ronie Spies, PA .   We discussed all aspects of the encounter. I agree with the assessment and plan as stated above.  1.  Non-ST segment elevation myocardial infarction: Malaina had a heart catheterization which revealed only minor coronary artery irregularities.  Her left ventriculogram was consistent with Takotsubo syndrome.  I personally reviewed the echocardiogram and the apical ballooning was not evident on echo but was clearly visible with the left ventriculogram.  Is been started on low-dose carvedilol and low-dose losartan.  We will titrate up the medications in the office.   I have spent a total of 40 minutes with patient reviewing hospital  notes , telemetry, EKGs, labs and examining patient as well as  establishing an assessment and plan that was discussed with the patient. > 50% of time was spent in direct patient care.    Vesta Mixer, Montez Hageman., MD, [facility] 01/20/2018, 5:47 PM [address],  [address] Office [phone] Pager [phone]

## 2018-01-23 ENCOUNTER — Telehealth: Payer: Self-pay | Admitting: Nurse Practitioner

## 2018-01-23 NOTE — Telephone Encounter (Signed)
   Pt called this AM to report that she has been feeling fatigued since discharge.  She wonders if she needs all of her d/c meds.  I reviewed her hosp course and cath.  She had a 40% RPDA stenosis and apical ballooning w/ nl EF  concerning for takotsubo.  I went over her meds, indications, and side effects w/ her.  I asked if she could take her BP over the next few days and call back w/ soft pressures or bring pressures to office visit on 12/4.  Caller verbalized understanding and was grateful for the call back.  Nicolasa Duckinghristopher Apphia Cropley, NP 01/23/2018, 9:29 AM

## 2018-01-27 ENCOUNTER — Encounter: Payer: Self-pay | Admitting: Cardiovascular Disease

## 2018-01-27 ENCOUNTER — Ambulatory Visit: Payer: BC Managed Care – PPO | Admitting: Cardiovascular Disease

## 2018-01-27 VITALS — BP 116/72 | HR 76 | Ht 62.0 in | Wt 143.0 lb

## 2018-01-27 DIAGNOSIS — I5181 Takotsubo syndrome: Secondary | ICD-10-CM

## 2018-01-27 NOTE — Patient Instructions (Addendum)
[facility] [phone]  Medication Instructions:  Your physician recommends that you continue on your current medications as directed. Please refer to the Current Medication list given to you today.  If you need a refill on your cardiac medications before your next appointment, please call your pharmacy.   Lab work: TODAY - basic metabolic panel If you have labs (blood work) drawn today and your tests are completely normal, you will receive your results only by: Marland Kitchen. MyChart Message (if you have MyChart) OR . A paper copy in the mail If you have any lab test that is abnormal or we need to change your treatment, we will call you to review the results.   Testing/Procedures: None Ordered   Follow-Up: At [facility], you and your health needs are our priority.  As part of our continuing mission to provide you with exceptional heart care, we have created designated Provider Care Teams.  These Care Teams include your primary Cardiologist (physician) and Advanced Practice Providers (APPs -  Physician Assistants and Nurse Practitioners) who all work together to provide you with the care you need, when you need it. You will need a follow up appointment in:  6 months.  Please call our office 2 months in advance to schedule this appointment.  You may see Kristeen MissPhilip Nahser, MD or one of the following Advanced Practice Providers on your designated Care Team: Tereso NewcomerScott Weaver, PA-C [place], [place] . Berton BonJanine Hammond, NP

## 2018-01-27 NOTE — Progress Notes (Signed)
Cardiology Office Note:    Date:  01/27/2018   ID:  [name] Age, DOB [birth year], MRN [id number]  PCP:  Patient, No Pcp Per  Cardiologist:  Kristeen Miss, MD  Electrophysiologist:  None   Referring MD: No ref. provider found   Chief Complaint  Patient presents with  . Coronary Artery Disease  Takotsubo Syndrome  History of Present Illness:    Alexis Ibarra is a 52 y.o. female with a hx of Takotsubo syndrome.  Seen with husband, Alexis Ibarra.   She was admitted to the hospital several weeks ago.  She had minimal troponin levels.  Cardiac cath revealed very minimal coronary artery disease but she had left ventriculogram that was consistent with takotsubo syndrome.  We started Coreg Has had some fatigue for a few days but this has resolved.  Has not been exercising.    Past Medical History:  Diagnosis Date  . Acne   . Anxiety   . Depression   . Takotsubo cardiomyopathy    a. NSTEMI 12/2017 -> cath with normal coronaries, preserved EF but apical ballooning suggestive of Takotsubo cardiomyopathy.    Past Surgical History:  Procedure Laterality Date  . LEFT HEART CATH AND CORONARY ANGIOGRAPHY N/A 01/19/2018   Procedure: LEFT HEART CATH AND CORONARY ANGIOGRAPHY;  Surgeon: Marykay Lex, MD;  [facility] INVASIVE CV LAB;  Service: Cardiovascular;  Laterality: N/A;    Current Medications: Current Meds  Medication Sig  . aspirin 81 MG EC tablet Take 1 tablet (81 mg total) by mouth daily.  . carvedilol (COREG) 3.125 MG tablet Take 1 tablet (3.125 mg total) by mouth 2 (two) times daily with a meal.  . doxycycline (PERIOSTAT) 20 MG tablet Take 20 mg by mouth 2 (two) times daily with a meal.  . losartan (COZAAR) 25 MG tablet Take 0.5 tablets (12.5 mg total) by mouth daily.  . nitroGLYCERIN (NITROSTAT) 0.4 MG SL tablet Place 1 tablet (0.4 mg total) under the tongue every 5 (five) minutes as needed for chest pain (up to 3 doses).  . rosuvastatin (CRESTOR) 5 MG tablet Take 1 tablet (5 mg  total) by mouth every evening.  . venlafaxine (EFFEXOR) 75 MG tablet Take 75 mg by mouth 2 (two) times daily.     Allergies:   Patient has no known allergies.   Social History   Socioeconomic History  . Marital status: Married    Spouse name: Not on file  . Number of children: Not on file  . Years of education: Not on file  . Highest education level: Not on file  Occupational History  . Not on file  Social Needs  . Financial resource strain: Not on file  . Food insecurity:    Worry: Not on file    Inability: Not on file  . Transportation needs:    Medical: Not on file    Non-medical: Not on file  Tobacco Use  . Smoking status: Never Smoker  . Smokeless tobacco: Never Used  Substance and Sexual Activity  . Alcohol use: Not on file  . Drug use: Not on file  . Sexual activity: Not on file  Lifestyle  . Physical activity:    Days per week: Not on file    Minutes per session: Not on file  . Stress: Not on file  Relationships  . Social connections:    Talks on phone: Not on file    Gets together: Not on file    Attends religious service: Not on file  Active member of club or organization: Not on file    Attends meetings of clubs or organizations: Not on file    Relationship status: Not on file  Other Topics Concern  . Not on file  Social History Narrative  . Not on file     Family History: The patient's family history includes Heart failure in her paternal grandfather; Hypertension in her father.  ROS:   Please see the history of present illness.     All other systems reviewed and are negative.  EKGs/Labs/Other Studies Reviewed:    The following studies were reviewed today:   EKG:     Recent Labs: 01/19/2018: ALT 22 01/20/2018: BUN 9; Creatinine, Ser 0.87; Hemoglobin 13.4; Platelets 269; Potassium 4.4; Sodium 139  Recent Lipid Panel    Component Value Date/Time   CHOL 158 01/19/2018 1047   TRIG 39 01/19/2018 1047   HDL 56 01/19/2018 1047   CHOLHDL  2.8 01/19/2018 1047   VLDL 8 01/19/2018 1047   LDLCALC 94 01/19/2018 1047    Physical Exam:    VS:  BP 116/72   Pulse 76   Ht 5\' 2"  (1.575 m)   Wt 143 lb (64.9 kg)   SpO2 99%   BMI 26.16 kg/m     Wt Readings from Last 3 Encounters:  01/27/18 143 lb (64.9 kg)  01/19/18 145 lb 8.1 oz (66 kg)     GEN:   Middle age female,  NAD  HEENT: Normal NECK: No JVD; No carotid bruits LYMPHATICS: No lymphadenopathy CARDIAC: RRR, no murmurs, rubs, gallops RESPIRATORY:  Clear to auscultation without rales, wheezing or rhonchi  ABDOMEN: Soft, non-tender, non-distended MUSCULOSKELETAL:  No edema; No deformity  SKIN: Warm and dry NEUROLOGIC:  Alert and oriented x 3 PSYCHIATRIC:  Normal affect   ASSESSMENT:    1. Takotsubo cardiomyopathy    PLAN:    In order of problems listed above:  1. Takotsubo syndrome:     She presented in late November with episodes of chest discomfort and a minimal troponin elevation.  Catheterization revealed only minor coronary artery irregularities but her left ventriculogram was consistent with Takotsubo syndrome. She was started on low-dose carvedilol and losartan.  She seems to be doing quite a bit better.  2.  Mild coronary artery disease: She had minimal coronary artery disease.  We started her on low-dose Crestor 5 mg a day. No angina  3.  Anxiety:   Has lots of anxiety  Have given her the name of a a counselor [facility] [phone]     Medication Adjustments/Labs and Tests Ordered: Current medicines are reviewed at length with the patient today.  Concerns regarding medicines are outlined above.  Orders Placed This Encounter  Procedures  . Basic Metabolic Panel (BMET)   No orders of the defined types were placed in this encounter.   Patient Instructions  [facility] [phone]  Medication Instructions:  Your physician recommends that you continue on your  current medications as directed. Please refer to the Current Medication list given to you today.  If you need a refill on your cardiac medications before your next appointment, please call your pharmacy.   Lab work: TODAY - basic metabolic panel If you have labs (blood work) drawn today and your tests are completely normal, you will receive your results only by: Marland Kitchen MyChart Message (if you have MyChart) OR . A paper copy in the mail If you have any lab test that is  abnormal or we need to change your treatment, we will call you to review the results.   Testing/Procedures: None Ordered   Follow-Up: At [facility], you and your health needs are our priority.  As part of our continuing mission to provide you with exceptional heart care, we have created designated Provider Care Teams.  These Care Teams include your primary Cardiologist (physician) and Advanced Practice Providers (APPs -  Physician Assistants and Nurse Practitioners) who all work together to provide you with the care you need, when you need it. You will need a follow up appointment in:  6 months.  Please call our office 2 months in advance to schedule this appointment.  You may see Kristeen MissPhilip Lorenza Winkleman, MD or one of the following Advanced Practice Providers on your designated Care Team: Tereso NewcomerScott Weaver, PA-C [place], [place] . Berton BonJanine Hammond, NP      Signed, Kristeen MissPhilip Lamonte Hartt, MD  01/27/2018 1:41 PM    [facility]

## 2018-01-28 LAB — BASIC METABOLIC PANEL
BUN/Creatinine Ratio: 21 (ref 9–23)
BUN: 17 mg/dL (ref 6–24)
CALCIUM: 9.4 mg/dL (ref 8.7–10.2)
CHLORIDE: 101 mmol/L (ref 96–106)
CO2: 20 mmol/L (ref 20–29)
Creatinine, Ser: 0.82 mg/dL (ref 0.57–1.00)
GFR calc non Af Amer: 82 mL/min/{1.73_m2} (ref >59)
GFR, EST AFRICAN AMERICAN: 94 mL/min/{1.73_m2} (ref >59)
Glucose: 80 mg/dL (ref 65–99)
POTASSIUM: 4.5 mmol/L (ref 3.5–5.2)
SODIUM: 138 mmol/L (ref 134–144)

## 2018-02-01 ENCOUNTER — Ambulatory Visit: Payer: BC Managed Care – PPO | Admitting: Cardiology

## 2018-04-28 ENCOUNTER — Telehealth: Payer: Self-pay | Admitting: Cardiovascular Disease

## 2018-04-28 NOTE — Telephone Encounter (Signed)
I spoke to the patient who called because she has been having palpitations ("weird, fluttering") over the past few days.  She has not had CP or SOB.  After speaking to her about caffeine intake (1 cup of coffee per day), she admitted that she has been indulging in quite a bit of chocolate over the past few days in her office.    I told her that I would inform Dr Elease Hashimoto and his nurse Marcelino Duster).  She will abstain from the chocolate and keep [place] updated of any changes.  She verbalized understanding and was thankful for the call.

## 2018-04-28 NOTE — Telephone Encounter (Signed)
New Message   Patient c/o Palpitations:  High priority if patient c/o lightheadedness, shortness of breath, or chest pain  1) How long have you had palpitations/irregular HR/ Afib? Are you having the symptoms now? Started feeling them yesterday during the day  Pt says it comes and goes   2) Are you currently experiencing lightheadedness, SOB or CP? No  3) Do you have a history of afib (atrial fibrillation) or irregular heart rhythm? No  4) Have you checked your BP or HR? (document readings if available): No  5) Are you experiencing any other symptoms? Pt has felt tired and is just concernred because this is the first time this has happened

## 2018-04-28 NOTE — Telephone Encounter (Signed)
Agree that her increased palpitations could be due to the increased chocolate.   Will see if she improves off the chocolate.

## 2018-06-15 ENCOUNTER — Ambulatory Visit: Payer: BC Managed Care – PPO | Admitting: Physician Assistant

## 2018-06-23 ENCOUNTER — Ambulatory Visit (INDEPENDENT_AMBULATORY_CARE_PROVIDER_SITE_OTHER): Payer: BC Managed Care – PPO | Admitting: Physician Assistant

## 2018-06-23 ENCOUNTER — Other Ambulatory Visit: Payer: Self-pay

## 2018-06-23 ENCOUNTER — Encounter: Payer: Self-pay | Admitting: Physician Assistant

## 2018-06-23 DIAGNOSIS — F411 Generalized anxiety disorder: Secondary | ICD-10-CM

## 2018-06-23 MED ORDER — VENLAFAXINE HCL 75 MG PO TABS: ORAL_TABLET | ORAL | 1 refills | Status: DC

## 2018-06-23 NOTE — Progress Notes (Signed)
[facility] MD/PA/NP Initial Note  06/23/2018 10:31 AM Alexis Ibarra  MRN:  [id number]  Chief Complaint:  Chief Complaint    Stress; Anxiety     Virtual Visit via Telephone Note  I connected with patient by a video enabled telemedicine application or telephone, with their informed consent, and verified patient privacy and that I am speaking with the correct person using two identifiers.  I am private, in my home and the patient is home.  I discussed the limitations, risks, security and privacy concerns of performing an evaluation and management service by telephone and the availability of in person appointments. I also discussed with the patient that there may be a patient responsible charge related to this service. The patient expressed understanding and agreed to proceed.   I discussed the assessment and treatment plan with the patient. The patient was provided an opportunity to ask questions and all were answered. The patient agreed with the plan and demonstrated an understanding of the instructions.   The patient was advised to call back or seek an in-person evaluation if the symptoms worsen or if the condition fails to improve as anticipated.  I provided 65 minutes of non-face-to-face time during this encounter.  HPI: Is having a lot of stress. "I shouldn't be.  I'm going to retire in October.  We're building a new house.  I shouldn't feel like I do."  Is on Effexor and has been on current dose for at least 4 months.  Has been on this drug since January 2019.  "I feel tired.  All the time.  I feel pulled in all different directions.  My daughter would love for me to live with her and do things all the time. I can never do enough for her.  It creates a lot of anxiety and stress between me and my husband.  I don't feel like she wants me to have a life outside of her and her family. I'd like to go home and put on my pajamas and relax, but she acts like I don't do enough."  Pt is not a social  person.  Very introverted. Even before the quarantine, she didn't want to go out much.  So none of that has changed.   Patient denies loss of interest in usual activities and is able to enjoy things.  Denies decreased energy or motivation.  Appetite has not changed.  No extreme sadness, tearfulness, or feelings of hopelessness.  Denies any changes in concentration, making decisions or remembering things.  Denies suicidal or homicidal thoughts.  Feels guilty about not doing enough for her daughter however.  Patient denies increased energy with decreased need for sleep, no increased talkativeness, no racing thoughts, no impulsivity or risky behaviors, no increased spending, no increased libido, no grandiosity.  Feels anxious a lot when she thinks about the things that she wants and needs to do for her parents, her daughter and her family, and her husband.  She does not have panic attacks but on occasion has had palpitations.  And then that worries her because of her cardiac issues.  See previous records.  Visit Diagnosis:    ICD-10-CM   1. Generalized anxiety disorder F41.1     Past Psychiatric History:  No psych hospitalizations. Never cut/burned herself on purpose. No suicide attempt. Has seen a therapist in the past which did seem helpful.  It is been several years since she saw someone.  Past medications for mental health diagnoses include: Celexa, Wellbutrin, Lexapro  Past  Medical History:  Past Medical History:  Diagnosis Date  . Acne   . Anxiety   . Depression   . Takotsubo cardiomyopathy    a. NSTEMI 12/2017 -> cath with normal coronaries, preserved EF but apical ballooning suggestive of Takotsubo cardiomyopathy.    Past Surgical History:  Procedure Laterality Date  . LEFT HEART CATH AND CORONARY ANGIOGRAPHY N/A 01/19/2018   Procedure: LEFT HEART CATH AND CORONARY ANGIOGRAPHY;  Surgeon: Marykay Lex, MD;  Location: [facility] INVASIVE CV [facility];  Service: Cardiovascular;  Laterality:  N/A;    Family Psychiatric History: See below  Family History:  Family History  Problem Relation Age of Onset  . Hypertension Father   . Kidney disease Father   . Cirrhosis Father        non alcoholic  . Heart failure Paternal Grandfather   . Prostate cancer Paternal Grandfather   . Hypothyroidism Mother   . Healthy Brother   . CAD Maternal Grandfather   . Dementia Maternal Grandmother   . Dementia Paternal Grandmother   . Depression Daughter     Social History:  Social History   Socioeconomic History  . Marital status: Married    Spouse name: Not on file  . Number of children: 1  . Years of education: Not on file  . Highest education level: Associate degree: academic program  Occupational History  . 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 Financial resource strain: Not hard at all  . Food insecurity:    Worry: Never true    Inability: Never true  . Transportation needs:    Medical: No    Non-medical: No  Tobacco Use  . Smoking status: Never Smoker  . Smokeless tobacco: Never Used  Substance and Sexual Activity  . Alcohol use: Not Currently  . Drug use: Not Currently  . Sexual activity: Not on file  Lifestyle  . Physical activity:    Days per week: 0 days    Minutes per session: 0 min  . Stress: Rather much  Relationships  . Social connections:    Talks on phone: Twice a week    Gets together: Twice a week    Attends religious service: More than 4 times per year    Active member of club or organization: No    Attends meetings of clubs or organizations: Never    Relationship status: Married  Other Topics Concern  . Not on file  Social History Narrative   2nd marriage.  4 years.     Has a 54 yo dtr from previous marriage. 2 grandsons.  2 stepdtrs.   Grew up with both parents in the home.  They've been married 54 years.  Has a younger brother.    Grew up in [place], [place] and still lives  there.    Went to [employer].  Plans to retire in Oct. And will help out family.      Christian      No legal issues       Caffeine 1-2 coffee/day.     Allergies: No Known Allergies  Metabolic Disorder Labs: Lab Results  Component Value Date   HGBA1C 4.6 (L) 01/19/2018   MPG 85.32 01/19/2018   No results found for: PROLACTIN Lab Results  Component Value Date   CHOL 158 01/19/2018   TRIG 39 01/19/2018   HDL 56 01/19/2018   CHOLHDL 2.8 01/19/2018  VLDL 8 01/19/2018   LDLCALC 94 01/19/2018   No results found for: TSH  Therapeutic Level Labs: No results found for: LITHIUM No results found for: VALPROATE No components found for:  CBMZ  Current Medications: Current Outpatient Medications  Medication Sig Dispense Refill  . aspirin 81 MG EC tablet Take 1 tablet (81 mg total) by mouth daily. 30 tablet 6  . carvedilol (COREG) 3.125 MG tablet Take 1 tablet (3.125 mg total) by mouth 2 (two) times daily with a meal. 60 tablet 6  . doxycycline (PERIOSTAT) 20 MG tablet Take 20 mg by mouth 2 (two) times daily as needed.   4  . ESTRADIOL-PROGESTERONE PO Take by mouth.    . losartan (COZAAR) 25 MG tablet Take 0.5 tablets (12.5 mg total) by mouth daily. 15 tablet 6  . nitroGLYCERIN (NITROSTAT) 0.4 MG SL tablet Place 1 tablet (0.4 mg total) under the tongue every 5 (five) minutes as needed for chest pain (up to 3 doses). 25 tablet 3  . rosuvastatin (CRESTOR) 5 MG tablet Take 1 tablet (5 mg total) by mouth every evening. 30 tablet 6  . venlafaxine (EFFEXOR) 75 MG tablet 2 po q am, 1 po qhs 90 tablet 1   No current facility-administered medications for this visit.     Medication Side Effects: none  Orders placed this visit:  No orders of the defined types were placed in this encounter.   Psychiatric Specialty Exam:  Review of Systems  Constitutional: Negative.   HENT: Negative.   Eyes: Negative.   Respiratory: Negative.   Cardiovascular: Negative.    Gastrointestinal: Negative.   Genitourinary: Negative.   Musculoskeletal: Negative.   Skin: Negative.   Neurological: Negative.   Endo/Heme/Allergies: Negative.   Psychiatric/Behavioral: The patient is nervous/anxious.     There were no vitals taken for this visit.There is no height or weight on file to calculate BMI.  General Appearance: telephone visit, unable to assess  Eye Contact:  unable to assess  Speech:  Clear and Coherent  Volume:  Normal  Mood:  Euthymic  Affect:  unable to assess  Thought Process:  Goal Directed  Orientation:  Full (Time, Place, and Person)  Thought Content: Logical   Suicidal Thoughts:  No  Homicidal Thoughts:  No  Memory:  WNL  Judgement:  Good  Insight:  Good  Psychomotor Activity:  unable to assess  Concentration:  Concentration: Good  Recall:  Good  Fund of Knowledge: Good  Language: Good  Assets:  Desire for Improvement  ADL's:  Intact  Cognition: WNL  Prognosis:  Good   Screenings:  GAD-7     Office Visit from 06/23/2018 in [facility]  Total GAD-7 Score  11    PHQ2-9     Office Visit from 06/23/2018 in [facility]  PHQ-2 Total Score  1      Receiving Psychotherapy: No   Treatment Plan/Recommendations:  Increase Effexor to 75 mg, 2 p.o. every morning and 1 p.o. nightly. We also discussed adding BuSpar.  Will hold off on that depending on how she does with the increased dose of Effexor. She will be seeing Rockne Menghini, LCSW soon. Return in 4 to 6 weeks.   Melony Overly, PA-C   This record has been created using [employer].  Chart creation errors have been sought, but may not always have been located and corrected. Such creation errors do not reflect on the standard of medical care.

## 2018-06-24 ENCOUNTER — Ambulatory Visit: Payer: BC Managed Care – PPO | Admitting: Psychiatry

## 2018-08-13 ENCOUNTER — Ambulatory Visit (INDEPENDENT_AMBULATORY_CARE_PROVIDER_SITE_OTHER): Payer: BC Managed Care – PPO | Admitting: Physician Assistant

## 2018-08-13 ENCOUNTER — Encounter: Payer: Self-pay | Admitting: Physician Assistant

## 2018-08-13 ENCOUNTER — Other Ambulatory Visit: Payer: Self-pay

## 2018-08-13 ENCOUNTER — Ambulatory Visit: Payer: BC Managed Care – PPO | Admitting: Psychiatry

## 2018-08-13 DIAGNOSIS — F411 Generalized anxiety disorder: Secondary | ICD-10-CM | POA: Diagnosis not present

## 2018-08-13 MED ORDER — VENLAFAXINE HCL 75 MG PO TABS: ORAL_TABLET | ORAL | 0 refills | Status: DC

## 2018-08-13 NOTE — Progress Notes (Signed)
[facility] Med Check  Patient ID: Alexis Ibarra,  MRN: [id number]  PCP: Patient, No Pcp Per  Date of Evaluation: 08/13/2018 Time spent:15 minutes  Chief Complaint:  Chief Complaint    Depression; Follow-up     Virtual Visit via Telephone Note  I connected with patient by a video enabled telemedicine application or telephone, with their informed consent, and verified patient privacy and that I am speaking with the correct person using two identifiers.  I am private, in my office and the patient is in her car.  Her husband is w/ her.   I discussed the limitations, risks, security and privacy concerns of performing an evaluation and management service by telephone and the availability of in person appointments. I also discussed with the patient that there may be a patient responsible charge related to this service. The patient expressed understanding and agreed to proceed.   I discussed the assessment and treatment plan with the patient. The patient was provided an opportunity to ask questions and all were answered. The patient agreed with the plan and demonstrated an understanding of the instructions.   The patient was advised to call back or seek an in-person evaluation if the symptoms worsen or if the condition fails to improve as anticipated.  I provided 15 minutes of non-face-to-face time during this encounter.  HISTORY/CURRENT STATUS: HPI For routine med check.  At LOV in April, we increased the Effexor.  States she is feeling quite a bit better.  She has more energy and motivation.  States the biggest problem she has is knowing when to say no to people.  But knows that a medication cannot help that part.  She does get a little anxious when she has to tell people no.  She is able to enjoy things.  Not isolating any more than is required due to the coronavirus pandemic.  Denies suicidal or homicidal thoughts.    She and her husband are building a new home and will move in soon.  She  has 2 grandchildren that she had doors.  She will be retiring in October and is extremely excited about that.  The only negative thing going on right now is that her dad is not in good health.  Denies dizziness, syncope, seizures, numbness, tingling, tremor, tics, unsteady gait, slurred speech, confusion. Denies muscle or joint pain, stiffness, or dystonia.  Individual Medical History/ Review of Systems: Changes? :No    Past medications for mental health diagnoses include: Celexa, Wellbutrin, Lexapro  Allergies: Patient has no known allergies.  Current Medications:  Current Outpatient Medications:  .  aspirin 81 MG EC tablet, Take 1 tablet (81 mg total) by mouth daily., Disp: 30 tablet, Rfl: 6 .  carvedilol (COREG) 3.125 MG tablet, Take 1 tablet (3.125 mg total) by mouth 2 (two) times daily with a meal., Disp: 60 tablet, Rfl: 6 .  ESTRADIOL-PROGESTERONE PO, Take by mouth., Disp: , Rfl:  .  losartan (COZAAR) 25 MG tablet, Take 0.5 tablets (12.5 mg total) by mouth daily., Disp: 15 tablet, Rfl: 6 .  nitroGLYCERIN (NITROSTAT) 0.4 MG SL tablet, Place 1 tablet (0.4 mg total) under the tongue every 5 (five) minutes as needed for chest pain (up to 3 doses)., Disp: 25 tablet, Rfl: 3 .  rosuvastatin (CRESTOR) 5 MG tablet, Take 1 tablet (5 mg total) by mouth every evening., Disp: 30 tablet, Rfl: 6 .  venlafaxine (EFFEXOR) 75 MG tablet, 2 po q am, 1 po qhs, Disp: 270 tablet, Rfl: 0 .  doxycycline (PERIOSTAT) 20 MG tablet, Take 20 mg by mouth 2 (two) times daily as needed. , Disp: , Rfl: 4 Medication Side Effects: none  Family Medical/ Social History: Changes? No  MENTAL HEALTH EXAM:  There were no vitals taken for this visit.There is no height or weight on file to calculate BMI.  General Appearance: unable to assess  Eye Contact:  unable to assess  Speech:  Clear and Coherent  Volume:  Normal  Mood:  Euthymic  Affect:  unable to assess  Thought Process:  Goal Directed  Orientation:  Full  (Time, Place, and Person)  Thought Content: Logical   Suicidal Thoughts:  No  Homicidal Thoughts:  No  Memory:  WNL  Judgement:  Good  Insight:  Good  Psychomotor Activity:  unable to assess  Concentration:  Concentration: Good  Recall:  Good  Fund of Knowledge: Good  Language: Good  Assets:  Desire for Improvement  ADL's:  Intact  Cognition: WNL  Prognosis:  Good    DIAGNOSES:    ICD-10-CM   1. Generalized anxiety disorder  F41.1     Receiving Psychotherapy: No will be seeing Rinaldo Cloud, LCSW soon   RECOMMENDATIONS: I am glad to see her doing so well! Continue Effexor 75 mg 2 p.o. every morning and 1 nightly. Return in 3 months.  Donnal Moat, PA-C   This record has been created using [employer].  Chart creation errors have been sought, but may not always have been located and corrected. Such creation errors do not reflect on the standard of medical care.

## 2018-08-22 ENCOUNTER — Other Ambulatory Visit: Payer: Self-pay | Admitting: Physician Assistant

## 2018-09-23 ENCOUNTER — Ambulatory Visit: Payer: BC Managed Care – PPO | Admitting: Cardiology

## 2018-10-17 ENCOUNTER — Other Ambulatory Visit: Payer: Self-pay | Admitting: Cardiovascular Disease

## 2018-11-09 ENCOUNTER — Other Ambulatory Visit: Payer: Self-pay | Admitting: Physician Assistant

## 2018-11-23 ENCOUNTER — Ambulatory Visit: Payer: BC Managed Care – PPO | Admitting: Psychiatry

## 2018-11-23 ENCOUNTER — Ambulatory Visit: Payer: BC Managed Care – PPO | Admitting: Physician Assistant

## 2018-12-07 ENCOUNTER — Ambulatory Visit: Payer: BC Managed Care – PPO | Admitting: Cardiovascular Disease

## 2018-12-07 ENCOUNTER — Encounter: Payer: Self-pay | Admitting: Cardiovascular Disease

## 2018-12-07 ENCOUNTER — Other Ambulatory Visit: Payer: Self-pay

## 2018-12-07 VITALS — BP 128/84 | HR 74 | Ht 62.0 in | Wt 144.1 lb

## 2018-12-07 DIAGNOSIS — F419 Anxiety disorder, unspecified: Secondary | ICD-10-CM | POA: Diagnosis not present

## 2018-12-07 DIAGNOSIS — I5181 Takotsubo syndrome: Secondary | ICD-10-CM

## 2018-12-07 DIAGNOSIS — I25119 Atherosclerotic heart disease of native coronary artery with unspecified angina pectoris: Secondary | ICD-10-CM

## 2018-12-07 LAB — BASIC METABOLIC PANEL
BUN/Creatinine Ratio: 18 (ref 9–23)
BUN: 15 mg/dL (ref 6–24)
CO2: 25 mmol/L (ref 20–29)
Calcium: 9.1 mg/dL (ref 8.7–10.2)
Chloride: 104 mmol/L (ref 96–106)
Creatinine, Ser: 0.82 mg/dL (ref 0.57–1.00)
GFR calc Af Amer: 94 mL/min/{1.73_m2} (ref >59)
GFR calc non Af Amer: 81 mL/min/{1.73_m2} (ref >59)
Glucose: 84 mg/dL (ref 65–99)
Potassium: 4.9 mmol/L (ref 3.5–5.2)
Sodium: 140 mmol/L (ref 134–144)

## 2018-12-07 LAB — HEPATIC FUNCTION PANEL
ALT: 24 IU/L (ref 0–32)
AST: 21 IU/L (ref 0–40)
Albumin: 4.7 g/dL (ref 3.8–4.9)
Alkaline Phosphatase: 53 IU/L (ref 39–117)
Bilirubin Total: 0.4 mg/dL (ref 0.0–1.2)
Bilirubin, Direct: 0.14 mg/dL (ref 0.00–0.40)
Total Protein: 7 g/dL (ref 6.0–8.5)

## 2018-12-07 LAB — LIPID PANEL
Chol/HDL Ratio: 2.3 ratio (ref 0.0–4.4)
Cholesterol, Total: 144 mg/dL (ref 100–199)
HDL: 63 mg/dL (ref >39)
LDL Chol Calc (NIH): 69 mg/dL (ref 0–99)
Triglycerides: 57 mg/dL (ref 0–149)
VLDL Cholesterol Cal: 12 mg/dL (ref 5–40)

## 2018-12-07 NOTE — Progress Notes (Signed)
Cardiology Office Note:    Date:  12/08/2018   ID:  Alexis Ibarra, DOB 1965/12/25, MRN [id number]  PCP:  Gweneth Fritter, FNP  Cardiologist:  Mertie Moores, MD  Electrophysiologist:  None   Referring MD: No ref. provider found   No chief complaint on file. Takotsubo Syndrome  History of Present Illness:    Alexis Ibarra is a 53 y.o. female with a hx of Takotsubo syndrome.  Seen with husband, Deidre Ala.   She was admitted to the hospital several weeks ago.  She had minimal troponin levels.  Cardiac cath revealed very minimal coronary artery disease but she had left ventriculogram that was consistent with takotsubo syndrome.  We started Coreg Has had some fatigue for a few days but this has resolved.  Has not been exercising.  December 07, 2018: [name] seen today for follow-up visit.  She was hospitalized with chest pain and positive troponin levels.  She was thought to have Takotsubo  Syndrome.  Has had 1 episode of CP .  Took 1 NTG  No further CP  Has retired from work ( worked in the [employer] office )     Past Medical History:  Diagnosis Date  . Acne   . Anxiety   . Depression   . Takotsubo cardiomyopathy    a. NSTEMI 12/2017 -> cath with normal coronaries, preserved EF but apical ballooning suggestive of Takotsubo cardiomyopathy.    Past Surgical History:  Procedure Laterality Date  . LEFT HEART CATH AND CORONARY ANGIOGRAPHY N/A 01/19/2018   Procedure: LEFT HEART CATH AND CORONARY ANGIOGRAPHY;  Surgeon: Leonie Man, MD;  Location: [place] CV LAB;  Service: Cardiovascular;  Laterality: N/A;    Current Medications: Current Meds  Medication Sig  . aspirin 81 MG EC tablet Take 1 tablet (81 mg total) by mouth daily.  . carvedilol (COREG) 3.125 MG tablet TAKE 1 TABLET(3.125 MG) BY MOUTH TWICE DAILY WITH A MEAL  . ESTRADIOL-PROGESTERONE PO Take by mouth.  . losartan (COZAAR) 25 MG tablet Take 0.5 tablets (12.5 mg total) by mouth daily.  . nitroGLYCERIN (NITROSTAT) 0.4 MG SL  tablet Place 1 tablet (0.4 mg total) under the tongue every 5 (five) minutes as needed for chest pain (up to 3 doses).  . rosuvastatin (CRESTOR) 5 MG tablet TAKE 1 TABLET(5 MG) BY MOUTH EVERY EVENING  . venlafaxine (EFFEXOR) 75 MG tablet TAKE 2 TABLETS BY MOUTH EVERY MORNING AND 1 TABLET EVERY NIGHT AT BEDTIME     Allergies:   Patient has no known allergies.   Social History   Socioeconomic History  . Marital status: Married    Spouse name: Not on file  . Number of children: 1  . Years of education: Not on file  . Highest education level: Associate degree: academic program  Occupational History  . 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 Financial resource strain: Not hard at all  . Food insecurity    Worry: Never true    Inability: Never true  . Transportation needs    Medical: No    Non-medical: No  Tobacco Use  . Smoking status: Never Smoker  . Smokeless tobacco: Never Used  Substance and Sexual Activity  . Alcohol use: Not Currently  . Drug use: Not Currently  . Sexual activity: Not on file  Lifestyle  . Physical activity    Days per week: 0 days    Minutes per  session: 0 min  . Stress: Rather much  Relationships  . Social Musicianconnections    Talks on phone: Twice a week    Gets together: Twice a week    Attends religious service: More than 4 times per year    Active member of club or organization: No    Attends meetings of clubs or organizations: Never    Relationship status: Married  Other Topics Concern  . Not on file  Social History Narrative   2nd marriage.  4 years.     Has a 54 yo dtr from previous marriage. 2 grandsons.  2 stepdtrs.   Grew up with both parents in the home.  They've been married 54 years.  Has a younger brother.    [place] and still lives there.    Went to [employer].  Plans to retire in Oct. And will help out family.      Christian      No  legal issues       Caffeine 1-2 coffee/day.      Family History: The patient's family history includes CAD in her maternal grandfather; Cirrhosis in her father; Dementia in her maternal grandmother and paternal grandmother; Depression in her daughter; Healthy in her brother; Heart failure in her paternal grandfather; Hypertension in her father; Hypothyroidism in her mother; Kidney disease in her father; Prostate cancer in her paternal grandfather.  ROS:   Please see the history of present illness.     All other systems reviewed and are negative.  EKGs/Labs/Other Studies Reviewed:    The following studies were reviewed today:   EKG:     Recent Labs: 01/20/2018: Hemoglobin 13.4; Platelets 269 12/07/2018: ALT 24; BUN 15; Creatinine, Ser 0.82; Potassium 4.9; Sodium 140  Recent Lipid Panel    Component Value Date/Time   CHOL 144 12/07/2018 0947   TRIG 57 12/07/2018 0947   HDL 63 12/07/2018 0947   CHOLHDL 2.3 12/07/2018 0947   CHOLHDL 2.8 01/19/2018 1047   VLDL 8 01/19/2018 1047   LDLCALC 69 12/07/2018 0947    Physical Exam: Blood pressure 128/84, pulse 74, height 5\' 2"  (1.575 m), weight 144 lb 1.9 oz (65.4 kg), SpO2 99 %.  GEN:  Well nourished, well developed in no acute distress HEENT: Normal NECK: No JVD; No carotid bruits LYMPHATICS: No lymphadenopathy CARDIAC: RRR , no murmurs, rubs, gallops RESPIRATORY:  Clear to auscultation without rales, wheezing or rhonchi  ABDOMEN: Soft, non-tender, non-distended MUSCULOSKELETAL:  No edema; No deformity  SKIN: Warm and dry NEUROLOGIC:  Alert and oriented x 3  December 07, 2018: Normal sinus rhythm at 74.  Low voltage QRS.  ASSESSMENT:    1. Coronary artery disease involving native coronary artery of native heart with angina pectoris (HCC)    PLAN:    In order of problems listed above:  1.  Takotsubo syndrome:    Stable,  Echo in Nov. Showed normal LV function  2.  Mild coronary artery disease:  Cont meds, asa,   creostor   3.  Anxiety:  Is seeing a councilor soon for change in meds    I will see in a year.    Medication Adjustments/Labs and Tests Ordered: Current medicines are reviewed at length with the patient today.  Concerns regarding medicines are outlined above.  Orders Placed This Encounter  Procedures  . Lipid panel  . Hepatic function panel  . Basic metabolic panel  . EKG 12-Lead   No orders of  the defined types were placed in this encounter.   Patient Instructions  Medication Instructions:   If you need a refill on your cardiac medications before your next appointment, please call your pharmacy.   Lab work: Your physician recommends that you have lab work today. Lipid and liver panel and BMET   If you have labs (blood work) drawn today and your tests are completely normal, you will receive your results only by: Marland Kitchen MyChart Message (if you have MyChart) OR . A paper copy in the mail If you have any lab test that is abnormal or we need to change your treatment, we will call you to review the results.  Testing/Procedures: None ordered today.  Follow-Up: At [facility], you and your health needs are our priority.  As part of our continuing mission to provide you with exceptional heart care, we have created designated Provider Care Teams.  These Care Teams include your primary Cardiologist (physician) and Advanced Practice Providers (APPs -  Physician Assistants and Nurse Practitioners) who all work together to provide you with the care you need, when you need it. You will need a follow up appointment in:  1 years.  Please call our office 2 months in advance to schedule this appointment.  You may see Kristeen Miss, MD or one of the following Advanced Practice Providers on your designated Care Team: Tereso Newcomer, PA-C [place], [place] . Berton Bon, NP      Signed, Kristeen Miss, MD  12/08/2018 5:40 PM    [facility]

## 2018-12-07 NOTE — Patient Instructions (Addendum)
Medication Instructions:   If you need a refill on your cardiac medications before your next appointment, please call your pharmacy.   Lab work: Your physician recommends that you have lab work today. Lipid and liver panel and BMET   If you have labs (blood work) drawn today and your tests are completely normal, you will receive your results only by: Marland Kitchen MyChart Message (if you have MyChart) OR . A paper copy in the mail If you have any lab test that is abnormal or we need to change your treatment, we will call you to review the results.  Testing/Procedures: None ordered today.  Follow-Up: At [facility], you and your health needs are our priority.  As part of our continuing mission to provide you with exceptional heart care, we have created designated Provider Care Teams.  These Care Teams include your primary Cardiologist (physician) and Advanced Practice Providers (APPs -  Physician Assistants and Nurse Practitioners) who all work together to provide you with the care you need, when you need it. You will need a follow up appointment in:  1 years.  Please call our office 2 months in advance to schedule this appointment.  You may see Mertie Moores, MD or one of the following Advanced Practice Providers on your designated Care Team: Richardson Dopp, PA-C [place], [place] . Daune Perch, NP

## 2018-12-14 ENCOUNTER — Other Ambulatory Visit: Payer: Self-pay

## 2018-12-14 ENCOUNTER — Encounter: Payer: Self-pay | Admitting: Physician Assistant

## 2018-12-14 ENCOUNTER — Ambulatory Visit (INDEPENDENT_AMBULATORY_CARE_PROVIDER_SITE_OTHER): Payer: BC Managed Care – PPO | Admitting: Psychiatry

## 2018-12-14 ENCOUNTER — Ambulatory Visit (INDEPENDENT_AMBULATORY_CARE_PROVIDER_SITE_OTHER): Payer: BC Managed Care – PPO | Admitting: Physician Assistant

## 2018-12-14 DIAGNOSIS — F329 Major depressive disorder, single episode, unspecified: Secondary | ICD-10-CM

## 2018-12-14 DIAGNOSIS — F411 Generalized anxiety disorder: Secondary | ICD-10-CM

## 2018-12-14 DIAGNOSIS — F32A Depression, unspecified: Secondary | ICD-10-CM

## 2018-12-14 MED ORDER — VENLAFAXINE HCL 75 MG PO TABS: ORAL_TABLET | ORAL | 1 refills | Status: DC

## 2018-12-14 NOTE — Progress Notes (Signed)
[facility] Med Check  Patient ID: Alexis Ibarra,  MRN: [id number]  PCP: Audie Pinto, FNP  Date of Evaluation: 12/14/2018 Time spent:15 minutes  Chief Complaint:  Chief Complaint    Depression; Follow-up      HISTORY/CURRENT STATUS: HPI For routine med check.  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  Excited about that!  She will be taking care of her 54-year-old and 8-year-old grandchildren, and also starting to take care of a nephew, who is 33 months old.  States she is feeling well.  Saw her cardiologist recently who felt that she is doing well and plans to see her in 1 year.  She sleeps well.  Anxiety is not very common.  She does have down days "like everybody else" but for the most part her mood is good.  She is able to enjoy things.  Energy and motivation are good.  Not crying easily.  Not isolating anymore than is necessary due to the coronavirus pandemic.  No suicidal or homicidal thoughts.  Denies dizziness, syncope, seizures, numbness, tingling, tremor, tics, unsteady gait, slurred speech, confusion. Denies muscle or joint pain, stiffness, or dystonia.  Individual Medical History/ Review of Systems: Changes? :No    Past medications for mental health diagnoses include: Celexa, Wellbutrin, Lexapro  Allergies: Patient has no known allergies.  Current Medications:  Current Outpatient Medications:  .  aspirin 81 MG EC tablet, Take 1 tablet (81 mg total) by mouth daily., Disp: 30 tablet, Rfl: 6 .  carvedilol (COREG) 3.125 MG tablet, TAKE 1 TABLET(3.125 MG) BY MOUTH TWICE DAILY WITH A MEAL, Disp: 180 tablet, Rfl: 0 .  ESTRADIOL-PROGESTERONE PO, Take by mouth., Disp: , Rfl:  .  losartan (COZAAR) 25 MG tablet, Take 0.5 tablets (12.5 mg total) by mouth daily., Disp: 15 tablet, Rfl: 6 .  nitroGLYCERIN (NITROSTAT) 0.4 MG SL tablet, Place 1 tablet (0.4 mg total) under the tongue every 5 (five) minutes as needed for chest pain (up to 3 doses).,  Disp: 25 tablet, Rfl: 3 .  rosuvastatin (CRESTOR) 5 MG tablet, TAKE 1 TABLET(5 MG) BY MOUTH EVERY EVENING, Disp: 90 tablet, Rfl: 0 .  venlafaxine (EFFEXOR) 75 MG tablet, TAKE 2 TABLETS BY MOUTH EVERY MORNING AND 1 TABLET EVERY NIGHT AT BEDTIME, Disp: 270 tablet, Rfl: 1 Medication Side Effects: none  Family Medical/ Social History: Changes? No  MENTAL HEALTH EXAM:  There were no vitals taken for this visit.There is no height or weight on file to calculate BMI.  General Appearance: Casual, Neat and Well Groomed  Eye Contact:  Good  Speech:  Clear and Coherent  Volume:  Normal  Mood:  Euthymic  Affect:  unable to assess  Thought Process:  Goal Directed and Descriptions of Associations: Intact  Orientation:  Full (Time, Place, and Person)  Thought Content: Logical   Suicidal Thoughts:  No  Homicidal Thoughts:  No  Memory:  WNL  Judgement:  Good  Insight:  Good  Psychomotor Activity:  Normal  Concentration:  Concentration: Good  Recall:  Good  Fund of Knowledge: Good  Language: Good  Assets:  Desire for Improvement  ADL's:  Intact  Cognition: WNL  Prognosis:  Good    DIAGNOSES:    ICD-10-CM   1. Generalized anxiety disorder  F41.1   2. Depression, unspecified depression type  F32.9     Receiving Psychotherapy:  seeing Rockne Menghini, LCSW today.   RECOMMENDATIONS: I am glad to see her doing so well and  congratulated her on her retirement! Continue Effexor 75 mg 2 p.o. every morning and 1 nightly. Return in 6 months.  Donnal Moat, PA-C

## 2018-12-14 NOTE — Progress Notes (Signed)
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  Has dealt with anxiety for "years, some depression earlier, but not now."  Dad is in Stage 4 of Kidney Failure.  Adult daughter is very possessive of patient and expects mom to always be available to her.    Mental Status Exam:   Appearance:   Neat     Behavior:  Appropriate and Sharing  Motor:  Normal  Speech/Language:   Normal Rate  Affect:  anxious  Mood:  anxious  Thought process:  goal directed  Thought content:    WNL  Sensory/Perceptual disturbances:    WNL  Orientation:  oriented to person, place, time/date, situation, day of week, month of year and year  Attention:  Good  Concentration:  Good  Memory:  WNL  Fund of knowledge:   Good  Insight:    Good  Judgment:   Good  Impulse Control:  Good   Reported Symptoms:  See symptoms above  Risk Assessment: Danger to Self:  No Self-injurious Behavior: No Danger to Others: No Duty to Warn:no Physical Aggression / Violence:No  Access to Firearms a concern: No  Gang Involvement:No  Patient / guardian was educated about steps to take if suicide or homicide risk level increases between visits:  Patient denies any SI or HI. While future psychiatric events cannot be accurately predicted, the patient does not currently require acute inpatient psychiatric care and does not currently meet [facility] involuntary commitment criteria.  Substance Abuse History: Current substance abuse: No     Past Psychiatric History:   Previous psychological history is significant for anxiety and  depression Outpatient Providers:in [place] History of Psych Hospitalization: No  Psychological Testing: n/a   Abuse History: Victim of No., n/a   Report needed: No. Victim of Neglect:No. Perpetrator of n/a  Witness / Exposure to Domestic Violence: No   Protective Services Involvement: No  Witness to [employer] Violence:  No   Family History: Patient confirms this info is accurate. Family History  Problem Relation Age of Onset  . Hypertension Father   . Kidney disease Father   . Cirrhosis Father        non alcoholic  . Heart failure Paternal Grandfather   . Prostate cancer Paternal Grandfather   . Hypothyroidism Mother   . Healthy Brother   . CAD Maternal Grandfather   . Dementia Maternal Grandmother   . Dementia Paternal Grandmother   . Depression Daughter     Living situation: the patient lives with their spouse  Sexual Orientation:  Straight  Relationship Status: married 5 1/2 Name of spouse / other             If a parent, number of children / ages: 1 daughter age 48  [place]; spouse friends  Museum/gallery curator Stress:  No   Income/Employment/Disability: Employment  Armed forces logistics/support/administrative officer: No   Educational History: Education: Museum/gallery exhibitions officer:   Pro[employer]ant  Any cultural differences that may affect / interfere with treatment:  not applicable   Recreation/Hobbies: family time  Stressors:Other: Dad is in Stage 4 in Kidney Failure  Strengths:  Supportive Relationships, Family, Friends, Spirituality and Hopefulness  Barriers: " tend to be a private person that often keeps to myself"   Legal History: Pending legal issue / charges: n/a. History of legal issue / charges: n/a  Medical History/Surgical History:Reviewed medical info below and patient confirms it is accurate. Past Medical History:  Diagnosis Date  . Acne   . Anxiety   . Depression   . Takotsubo cardiomyopathy    a. NSTEMI 12/2017 -> cath with normal  coronaries, preserved EF but apical ballooning suggestive of Takotsubo cardiomyopathy.    Past Surgical History:  Procedure Laterality Date  . LEFT HEART CATH AND CORONARY ANGIOGRAPHY N/A 01/19/2018   Procedure: LEFT HEART CATH AND CORONARY ANGIOGRAPHY;  Surgeon: Marykay LexHarding, David W, MD;  [facility] INVASIVE [facility] LAB;  Service: Cardiovascular;  Laterality: N/A;    Medications: Current Outpatient Medications  Medication Sig Dispense Refill  . aspirin 81 MG EC tablet Take 1 tablet (81 mg total) by mouth daily. 30 tablet 6  . carvedilol (COREG) 3.125 MG tablet TAKE 1 TABLET(3.125 MG) BY MOUTH TWICE DAILY WITH A MEAL 180 tablet 0  . ESTRADIOL-PROGESTERONE PO Take by mouth.    . losartan (COZAAR) 25 MG tablet Take 0.5 tablets (12.5 mg total) by mouth daily. 15 tablet 6  . nitroGLYCERIN (NITROSTAT) 0.4 MG SL tablet Place 1 tablet (0.4 mg total) under the tongue every 5 (five) minutes as needed for chest pain (up to 3 doses). 25 tablet 3  . rosuvastatin (CRESTOR) 5 MG tablet TAKE 1 TABLET(5 MG) BY MOUTH EVERY EVENING 90 tablet 0  . venlafaxine (EFFEXOR) 75 MG tablet TAKE 2 TABLETS BY MOUTH EVERY MORNING AND 1 TABLET EVERY NIGHT AT BEDTIME 270 tablet 1   No current facility-administered medications for this visit.     No Known Allergies-  Patient reports none  Diagnoses:    ICD-10-CM   1. Generalized anxiety disorder  F41.1      Plan of Care:   53 yr old caucasian female, married for the second time 5 1/2 yrs.  Previously married for 23 yrs and divorced.  Has 1 daughter who she describes as "very over-possessive and that is a big stressor for me."  Has 2 step-daughters but not very close.  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  Has several friend but is a more inward private person. Attends [employer] but "people there can be too out-going for me, I prefer to be more quiet and to myself but am friendly."  Has struggled a long time with anxiety, probably  about 20-25 yrs. Some depression also in past 8-9 yr. Is retiring Nov. 1, 2020.  Patient having lots of difficulty setting healthier boundaries with adult daughter.  Current stressors: "Main Stressors" per patient 1. Dad is in Stage 4 Kidney Disease 2. Daughter is very over-possessive and controlling, expects mom to always be there 3.  Worries; Looks for what may go wrong vs right  Treatment Goal Plan: Patient not signing tx plan on computer screen due to COVID.  Treatment Goals: Goals remain on tx plan while patient works on strategies to reach her goals.  Progress is documented each session in "Progress" section of Plan.  Long term goal: Reduce overall level, frequency, and intensity of anxiety so that daily functioning is not impaired.  Short term goal: Increase understanding of beliefs and messages that produce worry and anxiety.  Strategy: 1.Explore cognitive messages that generate anxiety and  retrain in adaptive cognitions. 2.Develop behavioral and cognitive strategies to reduce or eliminate irrational anxiety.  PROGRESS: Patient actively involved in creating treatment plan as we discussed each goal and strategy and how it can benefit her.  She is motivated but does second-guess herself, and can be a bit hesitant.  This is our first session together and I sense she was able to build some trust which is important for her.  She does have one initial homework assignment which is to begin self-monitoring her thoughts, paying close attention to thoughts that are anxious or negative.  She is to keep notes on this to share at next session.  If she finds identifying these thoughts easy, then she is to also practice interrupting them.  Will pick up on this next session.    To return within 2 weeks.   Mathis Fare, LCSW

## 2019-01-03 ENCOUNTER — Other Ambulatory Visit: Payer: Self-pay

## 2019-01-03 ENCOUNTER — Ambulatory Visit (INDEPENDENT_AMBULATORY_CARE_PROVIDER_SITE_OTHER): Payer: BC Managed Care – PPO | Admitting: Psychiatry

## 2019-01-03 DIAGNOSIS — F411 Generalized anxiety disorder: Secondary | ICD-10-CM

## 2019-01-03 NOTE — Progress Notes (Signed)
[facility] Counselor/Therapist Progress Note  Patient ID: Alexis Ibarra, MRN: [id number],    Date: 01/03/2019  Time Spent: 60 minutes   8:00am to 9:00am  Treatment Type: Individual Therapy  Reported Symptoms:  Anxiety, some depression, "stressed-out at times", some sadness re: dad's health concerns  Mental Status Exam:  Appearance:   Casual     Behavior:  Appropriate and Sharing  Motor:  Normal  Speech/Language:   Normal Rate  Affect:  anxious, slight tearfulness at times  Mood:  anxious, sad  Thought process:  normal  Thought content:    WNL  Sensory/Perceptual disturbances:    WNL  Orientation:  oriented to person, place, time/date, situation, day of week, month of year and year  Attention:  Good  Concentration:  Good  Memory:  WNL  Fund of knowledge:   Good  Insight:    Good  Judgment:   Good  Impulse Control:  Good   Risk Assessment: Danger to Self:  No Self-injurious Behavior: No Danger to Others: No Duty to Warn:no Physical Aggression / Violence:No  Access to Firearms a concern: No  Gang Involvement:No   Subjective: Patient in today with anxiety and some depression.  Concerns with husband and some depression.  Dad's health (liver and kidney issues) has also affected his mood.  Patient in today sharing understandable fears and sadness, has been close to Dad a long time. Shared that husband is attorney that she met through her parents.  Dad now getting to point of not accepting her husband, and have not seen each other in past several months.  "Dad has a problem with How he says What he says." All this is very stressful and sad for patient.   Interventions: Cognitive Behavioral Therapy and Solution-Oriented/Positive Psychology  Diagnosis:   ICD-10-CM   1. Generalized anxiety disorder  F41.1     Plan of Care: Patient not signing tx plan on computer screen due to [place].  Treatment Goals: Goals remain on tx plan while patient works on strategies to reach  her goals.  Progress is documented each session in "Progress" section of Plan.  Long term goal: Reduce overall level, frequency, and intensity of anxiety so that daily functioning is not impaired.  Short term goal: Increase understanding of beliefs and messages that produce worry and anxiety.  Strategy: 1.Explore cognitive messages that generate anxiety and retrain in adaptive cognitions. 2.Develop behavioral and cognitive strategies to reduce or eliminate irrational anxiety.  PROGRESS: Patient reports that her main stressors are:  1)Her dad being in Stage 4 Kidney Disease and refused Dialysis 2)Over-possessive and controlling daughter who expects patient to always be there 3)Chronic worrying, looking for what may go wrong versus right  She focus primarily on #1 and some on #3 today as we worked more intentional on her goals.  Patient shared her anxious/negative/worrying thoughts and was able to at least partially see and accept that these thoughts are directly related to her anxious feelings. Hard for her to accept this totally but did work with me on some changing of one of her thoughts and at lest considering how by changing anxious/negative thoughts, she may end up with feelings that are more calming, positive, and reality-based.  We need to continue working on this with specific examples, as patient is not rejecting this idea that our feelings are connected to our thoughts, but it does seem very difficult for her to accept totally and she has a history of feeling anxious and worrying excessively.  Does seem motivated and was more verbal in her sharing today.  Has shared before that she tends to be very inward and not share openly with very many people. Still second-guesses herself. Was not able to follow through on the initial assignment of self-monitoring, so it to begin monitoring her thoughts between now and next session, particularly noticing the anxious/negative thoughts.  Will pick  up on this at next session.  Goal review and positives and strengths pointed out to patient.     Next appt within 2 weeks.   Shanon Ace, LCSW

## 2019-01-14 ENCOUNTER — Other Ambulatory Visit: Payer: Self-pay | Admitting: Cardiovascular Disease

## 2019-01-17 ENCOUNTER — Other Ambulatory Visit: Payer: Self-pay

## 2019-01-17 ENCOUNTER — Ambulatory Visit (INDEPENDENT_AMBULATORY_CARE_PROVIDER_SITE_OTHER): Payer: BC Managed Care – PPO | Admitting: Psychiatry

## 2019-01-17 ENCOUNTER — Other Ambulatory Visit: Payer: Self-pay | Admitting: Cardiovascular Disease

## 2019-01-17 DIAGNOSIS — F411 Generalized anxiety disorder: Secondary | ICD-10-CM | POA: Diagnosis not present

## 2019-01-17 MED ORDER — NITROGLYCERIN 0.4 MG SL SUBL: 0.4000 mg | SUBLINGUAL_TABLET | SUBLINGUAL | 6 refills | Status: DC | PRN

## 2019-01-17 MED ORDER — ROSUVASTATIN CALCIUM 5 MG PO TABS: ORAL_TABLET | ORAL | 3 refills | Status: DC

## 2019-01-17 MED ORDER — LOSARTAN POTASSIUM 25 MG PO TABS: 12.5000 mg | ORAL_TABLET | Freq: Every day | ORAL | 3 refills | Status: DC

## 2019-01-17 MED ORDER — CARVEDILOL 3.125 MG PO TABS: ORAL_TABLET | ORAL | 3 refills | Status: DC

## 2019-01-17 NOTE — Progress Notes (Addendum)
      [facility] Counselor/Therapist Progress Note  Patient ID: Alexis Ibarra, MRN: [id number],    Date: 01/17/2019  Time Spent: 60 minutes   8:00am to 9:00am  Treatment Type: Individual Therapy  Reported Symptoms: anxiety, "weepy" and I'm that way during the holidays, family issues especially dad.   Mental Status Exam:  Appearance:   Casual     Behavior:  Appropriate and Sharing  Motor:  Normal  Speech/Language:   Normal Rate  Affect:  anxious, some depression  Mood:  anxious and depressed  Thought process:  normal  Thought content:    WNL  Sensory/Perceptual disturbances:    WNL  Orientation:  oriented to person, place, time/date, situation, day of week, month of year and year  Attention:  Good  Concentration:  Good  Memory:  WNL  Fund of knowledge:   Good  Insight:    Good  Judgment:   Good  Impulse Control:  Good   Risk Assessment: Danger to Self:  No Self-injurious Behavior: No Danger to Others: No Duty to Warn:no Physical Aggression / Violence:No  Access to Firearms a concern: No  Gang Involvement:No   Subjective:  Patient in today with sadness and anxiety, some depression, mostly due to family situations (dad and daughter relationships) and dad's Stage 4 kidney disease.   Interventions: Cognitive Behavioral Therapy and Ego-Supportive  Diagnosis:   ICD-10-CM   1. Generalized anxiety disorder  F41.1      Plan of Care: Patient not signing tx plan on computer screen due to [place].  Treatment Goals: Goals remain on tx plan while patient works on strategies to reach her goals. Progress is documented each session in "Progress" section of Plan.  Long term goal: Reduce overall level, frequency, and intensity of anxiety so that daily functioning is not impaired.  Short term goal: Increase understanding of beliefs and messages that produce worry and anxiety.  Strategy: 1.Explore cognitive messages that generate anxiety and retrain in adaptive  cognitions. 2.Develop behavioral and cognitive strategies to reduce or eliminate irrational anxiety.  PROGRESS: Patient working today on her anxiety in relationships with daughter, and her dad, and husband. Longer history of feeling dad doesn't like her nor want her around. Dad does not like her husband and doesn't let him come to their home and that hurts patient.  Good times for her are when she's with her husband, and sometimes with grandkids.  Continues to worry about dad with Stage 4 kidney disease.  Daughter controlling and over-possessive.  Chronic worrying. Working on not assuming the worst and limiting her worrying.  Looking at the thoughts (anxious and negative) and using several examples we worked in session on thought interruption and replacement with more uplifting, reality-based, and self-affirming thougths.  She is to continue practicing these between now and next appt.  Goal review and progress/efforts noted with patient.    Next appt within 2 weeks.   Shanon Ace, LCSW

## 2019-01-17 NOTE — Telephone Encounter (Signed)
Pt's medications were sent to pt's pharmacy as requested. Confirmation received.  

## 2019-01-20 ENCOUNTER — Other Ambulatory Visit: Payer: Self-pay | Admitting: Physician Assistant

## 2019-02-07 ENCOUNTER — Ambulatory Visit: Payer: BC Managed Care – PPO | Admitting: Psychiatry

## 2019-02-21 ENCOUNTER — Ambulatory Visit: Payer: BC Managed Care – PPO | Admitting: Psychiatry

## 2019-06-13 ENCOUNTER — Ambulatory Visit: Payer: BC Managed Care – PPO | Admitting: Physician Assistant

## 2019-06-27 ENCOUNTER — Other Ambulatory Visit: Payer: Self-pay | Admitting: Physician Assistant

## 2019-07-05 ENCOUNTER — Ambulatory Visit: Payer: BC Managed Care – PPO | Admitting: Orthopaedic Surgery

## 2019-07-05 ENCOUNTER — Ambulatory Visit: Payer: Self-pay

## 2019-07-05 ENCOUNTER — Other Ambulatory Visit: Payer: Self-pay

## 2019-07-05 ENCOUNTER — Encounter: Payer: Self-pay | Admitting: Orthopaedic Surgery

## 2019-07-05 VITALS — Ht 62.5 in | Wt 147.0 lb

## 2019-07-05 DIAGNOSIS — M25551 Pain in right hip: Secondary | ICD-10-CM

## 2019-07-05 DIAGNOSIS — M25559 Pain in unspecified hip: Secondary | ICD-10-CM

## 2019-07-05 NOTE — Progress Notes (Signed)
Office Visit Note   Patient: Alexis Ibarra           Date of Birth: 06-27-1965           MRN: [id number] Visit Date: 07/05/2019              Requested by: Audie Pinto, FNP [address] [place],  [place] [address] PCP: Audie Pinto, FNP   Assessment & Plan: Visit Diagnoses:  1. Hip pain     Plan: Patient's exam and x-rays are unremarkable.  She can use the meloxicam intermittently take it for a week to see how it does.  I will recheck her in 4 weeks.  We discussed avoiding carrying both children on her hips when she is up and around.  They can climb in her lap when she sitting.  Activity modification discussed recheck 4 weeks.  Follow-Up Instructions: Return in about 4 weeks (around 08/02/2019).   Orders:  Orders Placed This Encounter  Procedures  . XR HIP UNILAT W OR W/O PELVIS 2-3 VIEWS RIGHT   No orders of the defined types were placed in this encounter.     Procedures: No procedures performed   Clinical Data: No additional findings.   Subjective: Chief Complaint  Patient presents with  . Right Hip - Pain    HPI 54 year old female seen with right hip pain for several months.  She had a job where she is doing a lot of walking and then retired in November  and has since been taking care of nephew and grandchild sometimes carrying both of them on her hip.  She had seen a chiropractor in the past who told her she had some hip bursitis and a complaint has been right lateral hip pain and some right groin pain.  She is not really noticed any limping.  Pain does not bother her at night.  She had a shot of cortisone in the buttocks noted short-term improvement.  Short course of prednisone by PCP gave her good improvement but then wore off when it was stopped.  She takes aspirin with a history of Takotsubo cardiomyopathy.  She denies bowel bladder problems no fever chills. Review of Systems review of all other  systems noncontributory to HPI other than as mentioned above.    Objective: Vital Signs: Ht 5' 2.5" (1.588 m)   Wt 147 lb (66.7 kg)   BMI 26.46 kg/m   Physical Exam Constitutional:      Appearance: She is well-developed.  HENT:     Head: Normocephalic.     Right Ear: External ear normal.     Left Ear: External ear normal.  Eyes:     Pupils: Pupils are equal, round, and reactive to light.  Neck:     Thyroid: No thyromegaly.     Trachea: No tracheal deviation.  Cardiovascular:     Rate and Rhythm: Normal rate.  Pulmonary:     Effort: Pulmonary effort is normal.  Abdominal:     Palpations: Abdomen is soft.  Skin:    General: Skin is warm and dry.  Neurological:     Mental Status: She is alert and oriented to person, place, and time.  Psychiatric:        Behavior: Behavior normal.     Ortho Exam patient has negative straight leg raising right left 3+ knee and ankle jerk no clonus.  Anterior tib EHL is normal she can heel and toe walk.  No trochanteric bursal tenderness negative  logroll to the hips full 40 degrees internal and external rotation both hips negative FABER test.  No tenderness over the ASIS.  No hip flexion contracture she can lie supine flexor knee to her chest with mild crepitus in the hip joint.  Quad strength abductor strength is normal.  Specialty Comments:  No specialty comments available.  Imaging: XR HIP UNILAT W OR W/O PELVIS 2-3 VIEWS RIGHT  Result Date: 07/05/2019 Standing AP pelvis frog-leg lateral right hip obtained and reviewed.  This shows normal joint space no soft tissue calcification. Impression:: Normal right hip x-rays    PMFS History: Patient Active Problem List   Diagnosis Date Noted  . Takotsubo cardiomyopathy 01/20/2018  . Anxiety 01/20/2018  . Depression 01/20/2018  . Acne 01/20/2018  . Elevated troponin 01/19/2018   Past Medical History:  Diagnosis Date  . Acne   . Anxiety   . Depression   . Takotsubo cardiomyopathy    a. NSTEMI 12/2017 -> cath with normal coronaries, preserved EF but  apical ballooning suggestive of Takotsubo cardiomyopathy.    Family History  Problem Relation Age of Onset  . Hypertension Father   . Kidney disease Father   . Cirrhosis Father        non alcoholic  . Heart failure Paternal Grandfather   . Prostate cancer Paternal Grandfather   . Hypothyroidism Mother   . Healthy Brother   . CAD Maternal Grandfather   . Dementia Maternal Grandmother   . Dementia Paternal Grandmother   . Depression Daughter     Past Surgical History:  Procedure Laterality Date  . LEFT HEART CATH AND CORONARY ANGIOGRAPHY N/A 01/19/2018   Procedure: LEFT HEART CATH AND CORONARY ANGIOGRAPHY;  Surgeon: Leonie Man, MD;  Location: [facility];  Service: Cardiovascular;  Laterality: N/A;   Social History   Occupational History  . 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 Smoking status: Never Smoker  . Smokeless tobacco: Never Used  Substance and Sexual Activity  . Alcohol use: Not Currently  . Drug use: Not Currently  . Sexual activity: Not on file

## 2019-08-02 ENCOUNTER — Encounter: Payer: Self-pay | Admitting: Orthopaedic Surgery

## 2019-08-02 ENCOUNTER — Ambulatory Visit: Payer: BC Managed Care – PPO | Admitting: Orthopaedic Surgery

## 2019-08-02 ENCOUNTER — Other Ambulatory Visit: Payer: Self-pay

## 2019-08-02 DIAGNOSIS — M25551 Pain in right hip: Secondary | ICD-10-CM

## 2019-08-02 NOTE — Progress Notes (Signed)
Office Visit Note   Patient: Alexis Ibarra           Date of Birth: Jan 01, 1966           MRN: [id number] Visit Date: 08/02/2019              Requested by: Audie Pinto, FNP [phone] [address] [place],  [place] [address] PCP: Audie Pinto, FNP   Assessment & Plan: Visit Diagnoses:  1. Pain in right hip     Plan: We discussed she likely has some disc protrusion which is aggravated by repetitive bending turning and twisting and should continue to limit this as much as she possibly can.  Her symptoms are not severe enough to consider proceeding with MRI scan at this point.  We discussed other options including epidurals possible surgery if she had symptom progression and development of progressive weakness.  I plan to recheck her in 3 months.  Follow-Up Instructions: No follow-ups on file.   Orders:  No orders of the defined types were placed in this encounter.  No orders of the defined types were placed in this encounter.     Procedures: No procedures performed   Clinical Data: No additional findings.   Subjective: Chief Complaint  Patient presents with  . Right Hip - Pain, Follow-up    HPI 54 year old female returns with ongoing problems with right buttocks and groin pain that radiates into her thigh.  She states meloxicam is helped she is concerned about long-term use he states she had a father had liver failure from some medications he had taken in the past and never drank.  Patient take care of 2 grandchildren both under 71 years of age 55 is progressed to walking the other has not and at times when she picks up one the other wants to be held as well.  We discussed activity modification avoiding is much bending and lifting of the children to minimize this.  Some things are not avoidable such as putting them in a car seat.  No night pain no chills or fever.  No problems on the left side.  Review of Systems 14 point system update unchanged from last office visit.  Positive  history of cardiomyopathy noted.   Objective: Vital Signs: BP 139/87   Pulse 81   Ht 5' 2.5" (1.588 m)   Wt 147 lb (66.7 kg)   BMI 26.46 kg/m   Physical Exam Constitutional:      Appearance: She is well-developed.  HENT:     Head: Normocephalic.     Right Ear: External ear normal.     Left Ear: External ear normal.  Eyes:     Pupils: Pupils are equal, round, and reactive to light.  Neck:     Thyroid: No thyromegaly.     Trachea: No tracheal deviation.  Cardiovascular:     Rate and Rhythm: Normal rate.  Pulmonary:     Effort: Pulmonary effort is normal.  Abdominal:     Palpations: Abdomen is soft.  Skin:    General: Skin is warm and dry.  Neurological:     Mental Status: She is alert and oriented to person, place, and time.  Psychiatric:        Behavior: Behavior normal.     Ortho Exam negative logroll right and left hip.  Negative straight leg raising.  No sciatic notch tenderness.  No tenderness over the lumbar spine.  Trochanteric bursa is nontender.  Reflexes upper and lower  extremities are 3-4+ no clonus of the lower extremities.  Normal heel toe gait good strength no atrophy.  Specialty Comments:  No specialty comments available.  Imaging: No results found.   PMFS History: Patient Active Problem List   Diagnosis Date Noted  . Pain in right hip 08/02/2019  . Takotsubo cardiomyopathy 01/20/2018  . Anxiety 01/20/2018  . Depression 01/20/2018  . Acne 01/20/2018  . Elevated troponin 01/19/2018   Past Medical History:  Diagnosis Date  . Acne   . Anxiety   . Depression   . Takotsubo cardiomyopathy    a. NSTEMI 12/2017 -> cath with normal coronaries, preserved EF but apical ballooning suggestive of Takotsubo cardiomyopathy.    Family History  Problem Relation Age of Onset  . Hypertension Father   . Kidney disease Father   . Cirrhosis Father        non alcoholic  . Heart failure Paternal Grandfather   . Prostate cancer Paternal Grandfather   .  Hypothyroidism Mother   . Healthy Brother   . CAD Maternal Grandfather   . Dementia Maternal Grandmother   . Dementia Paternal Grandmother   . Depression Daughter     Past Surgical History:  Procedure Laterality Date  . LEFT HEART CATH AND CORONARY ANGIOGRAPHY N/A 01/19/2018   Procedure: LEFT HEART CATH AND CORONARY ANGIOGRAPHY;  Surgeon: Leonie Man, MD;  Location: [facility];  Service: Cardiovascular;  Laterality: N/A;   Social History   Occupational History  . Occupation: Forensic psychologist: Why: [employer]  Tobacco Use  . Smoking status: Never Smoker  . Smokeless tobacco: Never Used  Substance and Sexual Activity  . Alcohol use: Not Currently  . Drug use: Not Currently  . Sexual activity: Not on file

## 2019-09-07 ENCOUNTER — Other Ambulatory Visit: Payer: Self-pay | Admitting: Physician Assistant

## 2019-09-08 NOTE — Telephone Encounter (Signed)
Last apt 11/2018 due back 6 months 

## 2019-10-17 ENCOUNTER — Ambulatory Visit (INDEPENDENT_AMBULATORY_CARE_PROVIDER_SITE_OTHER): Payer: BC Managed Care – PPO | Admitting: Physician Assistant

## 2019-10-17 ENCOUNTER — Encounter: Payer: Self-pay | Admitting: Physician Assistant

## 2019-10-17 ENCOUNTER — Other Ambulatory Visit: Payer: Self-pay

## 2019-10-17 DIAGNOSIS — F4321 Adjustment disorder with depressed mood: Secondary | ICD-10-CM | POA: Diagnosis not present

## 2019-10-17 DIAGNOSIS — F411 Generalized anxiety disorder: Secondary | ICD-10-CM | POA: Diagnosis not present

## 2019-10-17 MED ORDER — MEGARED OMEGA-3 KRILL OIL 500 MG PO CAPS: 500.0000 mg | ORAL_CAPSULE | Freq: Every day | ORAL | 11 refills | Status: AC

## 2019-10-17 MED ORDER — VENLAFAXINE HCL 100 MG PO TABS: ORAL_TABLET | ORAL | 1 refills | Status: DC

## 2019-10-17 MED ORDER — THERA VITAL M PO TABS: 1.0000 | ORAL_TABLET | Freq: Every day | ORAL | 11 refills | Status: DC

## 2019-10-17 MED ORDER — B COMPLEX PO TABS: 1.0000 | ORAL_TABLET | Freq: Every day | ORAL | 11 refills | Status: DC

## 2019-10-17 NOTE — Progress Notes (Signed)
[facility]  Patient ID: Alexis Ibarra,  MRN: [id number]  PCP: Audie Pinto, FNP  Date of Evaluation: 10/17/2019 Time spent:20 minutes  Chief Complaint:  Chief Complaint    Anxiety; Follow-up      HISTORY/CURRENT STATUS: HPI For routine med check.  Has been doing well for the most part. She's an introvert so the pandemic really hasn't bothered her. Hasn't had any sx of depression. Is able to enjoy things but doesn't like to go out, that isn't new and hasn't worsened. Energy and motivation are good. She takes care of her 2 grandsons and her 82 yo nephew and is able to do that. Not isolating any more than usual. No SI/HI.  Does have a little more anxiety, her 9 yo grandson says she's stressed. She does worry about her mom some, but feels like that is nl.   She does not feel like there is anything other than that could be stressing her out.  She is not sure where her grandson gets that thought from.  She does ask about the Effexor and wonders if increasing the dose may help her mood some.  Patient denies increased energy with decreased need for sleep, no increased talkativeness, no racing thoughts, no impulsivity or risky behaviors, no increased spending, no increased libido, no grandiosity, no increased irritability or anger, and no hallucinations.  Denies dizziness, syncope, seizures, numbness, tingling, tremor, tics, unsteady gait, slurred speech, confusion. Denies muscle or joint pain, stiffness, or dystonia.  Individual Medical History/ Review of Systems: Changes? :No    Past medications for mental health diagnoses include: Celexa, Wellbutrin, Lexapro  Allergies: Patient has no known allergies.  Current Medications:  Current Outpatient Medications:  .  aspirin 81 MG EC tablet, Take 1 tablet (81 mg total) by mouth daily., Disp: 30 tablet, Rfl: 6 .  carvedilol (COREG) 3.125 MG tablet, TAKE 1 TABLET(3.125 MG) BY MOUTH TWICE DAILY WITH A MEAL, Disp: 180 tablet, Rfl: 3 .   cholecalciferol (VITAMIN D3) 25 MCG (1000 UNIT) tablet, Take 5,000 Units by mouth daily., Disp: , Rfl:  .  ESTRADIOL-PROGESTERONE PO, Take by mouth., Disp: , Rfl:  .  losartan (COZAAR) 25 MG tablet, Take 0.5 tablets (12.5 mg total) by mouth daily., Disp: 45 tablet, Rfl: 3 .  nitroGLYCERIN (NITROSTAT) 0.4 MG SL tablet, Place 1 tablet (0.4 mg total) under the tongue every 5 (five) minutes as needed for chest pain (up to 3 doses)., Disp: 25 tablet, Rfl: 6 .  rosuvastatin (CRESTOR) 5 MG tablet, TAKE 1 TABLET(5 MG) BY MOUTH EVERY EVENING, Disp: 90 tablet, Rfl: 3 .  vitamin B-12 (CYANOCOBALAMIN) 250 MCG tablet, Take 250 mcg by mouth daily., Disp: , Rfl:  .  b complex vitamins tablet, Take 1 tablet by mouth daily., Disp: 30 tablet, Rfl: 11 .  MegaRed Omega-3 Krill Oil 500 MG CAPS, Take 500 mg by mouth daily., Disp: 30 capsule, Rfl: 11 .  meloxicam (MOBIC) 7.5 MG tablet, Take 7.5 mg by mouth daily. (Patient not taking: Reported on 10/17/2019), Disp: , Rfl:  .  Multiple Vitamins-Minerals (MULTIVITAMIN) tablet, Take 1 tablet by mouth daily., Disp: 30 tablet, Rfl: 11 .  venlafaxine (EFFEXOR) 100 MG tablet, 2 po q am, 1 po qhs., Disp: 90 tablet, Rfl: 1 Medication Side Effects: none  Family Medical/ Social History: Changes? Dad died Jun 10, 2022. She retired last Nov. Keeps her nephew and her grandsons some days of the week.   MENTAL HEALTH EXAM:  There were no vitals taken for this  visit.There is no height or weight on file to calculate BMI.  General Appearance: Casual, Neat and Well Groomed  Eye Contact:  Good  Speech:  Clear and Coherent and Normal Rate  Volume:  Normal  Mood:  Euthymic  Affect:  Appropriate  Thought Process:  Goal Directed and Descriptions of Associations: Intact  Orientation:  Full (Time, Place, and Person)  Thought Content: Logical   Suicidal Thoughts:  No  Homicidal Thoughts:  No  Memory:  WNL  Judgement:  Good  Insight:  Good  Psychomotor Activity:  Normal  Concentration:   Concentration: Good and Attention Span: Good  Recall:  Good  Fund of Knowledge: Good  Language: Good  Assets:  Desire for Improvement  ADL's:  Intact  Cognition: WNL  Prognosis:  Good    DIAGNOSES:    ICD-10-CM   1. Generalized anxiety disorder  F41.1   2. Grief  F43.21     Receiving Psychotherapy:  No  RECOMMENDATIONS: PDMP was reviewed. I provided 20 minutes of face-to-face time during this encounter. We discussed the anxiety.  In general the SNRIs are not quite as effective for anxiety as the SSRIs.  But this has worked for her for a while and I think increasing the dose may be helpful.  I do think what she is experiencing is reactive anxiety and sadness due to the death of her father 4 months ago and understandable concern for her mother. Increase Effexor 100 mg, 2 p.o. every morning and 1 p.o. nightly.  (Up from 225 mg total to 300 mg total per day.) Consider counseling again as needed.  Does not really feel like she needs at this point but she has seen Rockne Menghini in the past. Return in 6 weeks.  Melony Overly, PA-C

## 2019-11-08 ENCOUNTER — Ambulatory Visit: Payer: BC Managed Care – PPO | Admitting: Orthopaedic Surgery

## 2019-11-13 ENCOUNTER — Other Ambulatory Visit: Payer: Self-pay | Admitting: Physician Assistant

## 2019-12-12 ENCOUNTER — Other Ambulatory Visit: Payer: Self-pay

## 2019-12-12 ENCOUNTER — Encounter: Payer: Self-pay | Admitting: Physician Assistant

## 2019-12-12 ENCOUNTER — Ambulatory Visit (INDEPENDENT_AMBULATORY_CARE_PROVIDER_SITE_OTHER): Payer: BC Managed Care – PPO | Admitting: Physician Assistant

## 2019-12-12 DIAGNOSIS — F411 Generalized anxiety disorder: Secondary | ICD-10-CM | POA: Diagnosis not present

## 2019-12-12 DIAGNOSIS — F32A Depression, unspecified: Secondary | ICD-10-CM | POA: Diagnosis not present

## 2019-12-12 MED ORDER — VENLAFAXINE HCL 100 MG PO TABS: ORAL_TABLET | ORAL | 0 refills | Status: DC

## 2019-12-12 MED ORDER — LORAZEPAM 0.5 MG PO TABS: 0.2500 mg | ORAL_TABLET | Freq: Three times a day (TID) | ORAL | 0 refills | Status: DC | PRN

## 2019-12-12 NOTE — Progress Notes (Signed)
[facility]  Patient ID: Alexis Ibarra,  MRN: [id number]  PCP: Audie Pinto, FNP  Date of Evaluation: 12/12/2019 Time spent:20 minutes  Chief Complaint:  Chief Complaint    Depression; Anxiety; Follow-up      HISTORY/CURRENT STATUS: HPI For routine med check.  Doesn't feel like she's depressed anymore. Feels like she's more anxious than anything. Like if she knows she's going to be in a big crowd, like going to Homecoming at a church she used to attend made her extremely anxious. Also went to her husband's family gathering over the weekend and she got nervous just anticipating being around other people in the family.  Once she gets to a function like that, she is fine.  Tells me that in the past she was given Ativan before blood was drawn, because she used to be so nervous that she would pass out.  The Ativan was helpful for those situations.  She still keeps her grandkids and her nephew sometimes.  She is enjoying retirement.  She is now been retired for a full year.  She is able to enjoy things.  Energy and motivation are good.  Not crying easily.  She is not isolating.  She is able to focus and get things done when needed.  No suicidal or homicidal thoughts.  Patient denies increased energy with decreased need for sleep, no increased talkativeness, no racing thoughts, no impulsivity or risky behaviors, no increased spending, no increased libido, no grandiosity, no increased irritability or anger, no paranoia, and no hallucinations.  Denies dizziness, syncope, seizures, numbness, tingling, tremor, tics, unsteady gait, slurred speech, confusion. Denies muscle or joint pain, stiffness, or dystonia.  Individual Medical History/ Review of Systems: Changes? :No    Past medications for mental health diagnoses include: Celexa, Wellbutrin, Lexapro, Ativan  Allergies: Patient has no known allergies.  Current Medications:  Current Outpatient Medications:  .  aspirin 81 MG EC  tablet, Take 1 tablet (81 mg total) by mouth daily., Disp: 30 tablet, Rfl: 6 .  b complex vitamins tablet, Take 1 tablet by mouth daily., Disp: 30 tablet, Rfl: 11 .  carvedilol (COREG) 3.125 MG tablet, TAKE 1 TABLET(3.125 MG) BY MOUTH TWICE DAILY WITH A MEAL, Disp: 180 tablet, Rfl: 3 .  cholecalciferol (VITAMIN D3) 25 MCG (1000 UNIT) tablet, Take 5,000 Units by mouth daily., Disp: , Rfl:  .  ESTRADIOL-PROGESTERONE PO, Take by mouth., Disp: , Rfl:  .  losartan (COZAAR) 25 MG tablet, Take 0.5 tablets (12.5 mg total) by mouth daily., Disp: 45 tablet, Rfl: 3 .  MegaRed Omega-3 Krill Oil 500 MG CAPS, Take 500 mg by mouth daily., Disp: 30 capsule, Rfl: 11 .  nitroGLYCERIN (NITROSTAT) 0.4 MG SL tablet, Place 1 tablet (0.4 mg total) under the tongue every 5 (five) minutes as needed for chest pain (up to 3 doses)., Disp: 25 tablet, Rfl: 6 .  rosuvastatin (CRESTOR) 5 MG tablet, TAKE 1 TABLET(5 MG) BY MOUTH EVERY EVENING, Disp: 90 tablet, Rfl: 3 .  venlafaxine (EFFEXOR) 100 MG tablet, TAKE 2 TABLETS BY MOUTH EVERY DAY IN THE MORNING AND TAKE 1 TABLET AT BEDTIME, Disp: 270 tablet, Rfl: 0 .  LORazepam (ATIVAN) 0.5 MG tablet, Take 0.5-1 tablets (0.25-0.5 mg total) by mouth every 8 (eight) hours as needed for anxiety., Disp: 30 tablet, Rfl: 0 .  meloxicam (MOBIC) 7.5 MG tablet, Take 7.5 mg by mouth daily. (Patient not taking: Reported on 10/17/2019), Disp: , Rfl:  .  Multiple Vitamins-Minerals (MULTIVITAMIN) tablet, Take 1  tablet by mouth daily. (Patient not taking: Reported on 12/12/2019), Disp: 30 tablet, Rfl: 11 .  vitamin B-12 (CYANOCOBALAMIN) 250 MCG tablet, Take 250 mcg by mouth daily. (Patient not taking: Reported on 12/12/2019), Disp: , Rfl:  Medication Side Effects: none  Family Medical/ Social History: Changes? Dad died 2022/06/25. She retired last Nov. Keeps her nephew and her grandsons some days of the week.   MENTAL HEALTH EXAM:  There were no vitals taken for this visit.There is no height or weight  on file to calculate BMI.  General Appearance: Casual, Neat and Well Groomed  Eye Contact:  Good  Speech:  Clear and Coherent and Normal Rate  Volume:  Normal  Mood:  Euthymic  Affect:  Appropriate  Thought Process:  Goal Directed and Descriptions of Associations: Intact  Orientation:  Full (Time, Place, and Person)  Thought Content: Logical   Suicidal Thoughts:  No  Homicidal Thoughts:  No  Memory:  WNL  Judgement:  Good  Insight:  Good  Psychomotor Activity:  Normal  Concentration:  Concentration: Good and Attention Span: Good  Recall:  Good  Fund of Knowledge: Good  Language: Good  Assets:  Desire for Improvement  ADL's:  Intact  Cognition: WNL  Prognosis:  Good    DIAGNOSES:    ICD-10-CM   1. Generalized anxiety disorder  F41.1   2. Depression, unspecified depression type  F32.A     Receiving Psychotherapy:  No has seen Rockne Menghini in the past.  RECOMMENDATIONS: PDMP was reviewed. I provided 20 minutes of face-to-face time during this encounter. We discussed the anxiety.  I recommend adding Ativan.  Since she has taken this before and knows that it helps, I think it would be a good addition to her regimen.  She will take it only as needed, maybe 30 minutes to an hour before she has to go to function as described above, or if she has blood drawn.  She verbalizes understanding and would like to try it.   We discussed the risks of benzodiazepines including sedation, increased risk of falling, dizziness, tolerance, and addictive potential.    Continue Effexor 100 mg, 2 p.o. every morning and 1 p.o. nightly.  Counseling as needed. Return in 3 months.  Melony Overly, PA-C

## 2020-02-08 ENCOUNTER — Other Ambulatory Visit: Payer: Self-pay | Admitting: Cardiovascular Disease

## 2020-03-12 ENCOUNTER — Other Ambulatory Visit: Payer: Self-pay | Admitting: Physician Assistant

## 2020-03-12 ENCOUNTER — Telehealth: Payer: Self-pay | Admitting: Physician Assistant

## 2020-03-12 ENCOUNTER — Encounter: Payer: Self-pay | Admitting: Physician Assistant

## 2020-03-12 ENCOUNTER — Telehealth (INDEPENDENT_AMBULATORY_CARE_PROVIDER_SITE_OTHER): Payer: BC Managed Care – PPO | Admitting: Physician Assistant

## 2020-03-12 DIAGNOSIS — F411 Generalized anxiety disorder: Secondary | ICD-10-CM | POA: Diagnosis not present

## 2020-03-12 DIAGNOSIS — F3342 Major depressive disorder, recurrent, in full remission: Secondary | ICD-10-CM | POA: Diagnosis not present

## 2020-03-12 DIAGNOSIS — F4321 Adjustment disorder with depressed mood: Secondary | ICD-10-CM

## 2020-03-12 MED ORDER — VENLAFAXINE HCL 100 MG PO TABS: ORAL_TABLET | ORAL | 1 refills | Status: DC

## 2020-03-12 NOTE — Telephone Encounter (Signed)
Ms. idalie, canto are scheduled for a virtual visit with your provider today.    Just as we do with appointments in the office, we must obtain your consent to participate.  Your consent will be active for this visit and any virtual visit you may have with one of our providers in the next 365 days.    If you have a MyChart account, I can also send a copy of this consent to you electronically.  All virtual visits are billed to your insurance company just like a traditional visit in the office.  As this is a virtual visit, video technology does not allow for your provider to perform a traditional examination.  This may limit your provider's ability to fully assess your condition.  If your provider identifies any concerns that need to be evaluated in person or the need to arrange testing such as labs, EKG, etc, we will make arrangements to do so.    Although advances in technology are sophisticated, we cannot ensure that it will always work on either your end or our end.  If the connection with a video visit is poor, we may have to switch to a telephone visit.  With either a video or telephone visit, we are not always able to ensure that we have a secure connection.   I need to obtain your verbal consent now.   Are you willing to proceed with your visit today?   Marijah Larranaga has provided verbal consent on 03/12/2020 for a virtual visit (video or telephone).   Melony Overly, PA-C 03/12/2020  9:53 AM

## 2020-03-12 NOTE — Progress Notes (Signed)
[facility]  Patient ID: Alexis Ibarra,  MRN: [id number]  PCP: Audie Pinto, FNP  Date of Evaluation: 03/12/2020 Time spent:20 minutes  Chief Complaint:  Chief Complaint    Anxiety; Depression; Follow-up       Virtual Visit via Telehealth  I connected with patient by a video enabled telemedicine application with their informed consent, and verified patient privacy and that I am speaking with the correct person using two identifiers.  I am private, in my home due to a snow day, and the patient is at home.  I discussed the limitations, risks, security and privacy concerns of performing an evaluation and management service by video and the availability of in person appointments. I also discussed with the patient that there may be a patient responsible charge related to this service. The patient expressed understanding and agreed to proceed.   I discussed the assessment and treatment plan with the patient. The patient was provided an opportunity to ask questions and all were answered. The patient agreed with the plan and demonstrated an understanding of the instructions.   The patient was advised to call back or seek an in-person evaluation if the symptoms worsen or if the condition fails to improve as anticipated.  I provided 20 minutes of non-face-to-face time during this encounter.  HISTORY/CURRENT STATUS: HPI For routine 51-month med check.  She is doing very well overall. She did get a little blue around Christmas and New Year's but it went away quickly. This is the first year without her dad, and she does worry about her mom some. She sees her mom or talks to her every day. Her brother does as well. So that is helpful knowing that her mom is doing okay.  At the last visit, we added Ativan for situational anxiety. She has only taken it 1 time, before a scheduled venipuncture. She still passed out, as she usually does but states it might have been a little better than it had  been. She has not needed the Ativan any other time.  She retired in 2019 and now keeps her grandkids and her nephew. That is going well. She is able to enjoy things.  Energy and motivation are good.  Not crying easily.  She is not isolating.  She is able to focus and get things done when needed.  No suicidal or homicidal thoughts.  Patient denies increased energy with decreased need for sleep, no increased talkativeness, no racing thoughts, no impulsivity or risky behaviors, no increased spending, no increased libido, no grandiosity, no increased irritability or anger, no paranoia, and no hallucinations.  Denies dizziness, syncope, seizures, numbness, tingling, tremor, tics, unsteady gait, slurred speech, confusion. Denies muscle or joint pain, stiffness, or dystonia.  Individual Medical History/ Review of Systems: Changes? :No    Past medications for mental health diagnoses include: Celexa, Wellbutrin, Lexapro, Ativan  Allergies: Patient has no known allergies.  Current Medications:  Current Outpatient Medications:  .  aspirin 81 MG EC tablet, Take 1 tablet (81 mg total) by mouth daily., Disp: 30 tablet, Rfl: 6 .  b complex vitamins tablet, Take 1 tablet by mouth daily., Disp: 30 tablet, Rfl: 11 .  carvedilol (COREG) 3.125 MG tablet, TAKE 1 TABLET(3.125 MG) BY MOUTH TWICE DAILY WITH A MEAL, Disp: 180 tablet, Rfl: 3 .  cholecalciferol (VITAMIN D3) 25 MCG (1000 UNIT) tablet, Take 5,000 Units by mouth daily., Disp: , Rfl:  .  ESTRADIOL-PROGESTERONE PO, Take by mouth., Disp: , Rfl:  .  LORazepam (ATIVAN) 0.5 MG tablet, Take 0.5-1 tablets (0.25-0.5 mg total) by mouth every 8 (eight) hours as needed for anxiety., Disp: 30 tablet, Rfl: 0 .  losartan (COZAAR) 25 MG tablet, Take 0.5 tablets (12.5 mg total) by mouth daily. Please keep upcoming appointment in Jan. 2022 for future refills. Thank you, Disp: 45 tablet, Rfl: 0 .  MegaRed Omega-3 Krill Oil 500 MG CAPS, Take 500 mg by mouth daily., Disp: 30  capsule, Rfl: 11 .  Multiple Vitamins-Minerals (MULTIVITAMIN) tablet, Take 1 tablet by mouth daily., Disp: 30 tablet, Rfl: 11 .  nitroGLYCERIN (NITROSTAT) 0.4 MG SL tablet, Place 1 tablet (0.4 mg total) under the tongue every 5 (five) minutes as needed for chest pain (up to 3 doses)., Disp: 25 tablet, Rfl: 6 .  rosuvastatin (CRESTOR) 5 MG tablet, TAKE 1 TABLET(5 MG) BY MOUTH EVERY EVENING, Disp: 90 tablet, Rfl: 3 .  venlafaxine (EFFEXOR) 100 MG tablet, TAKE 2 TABLETS BY MOUTH EVERY DAY IN THE MORNING AND TAKE 1 TABLET AT BEDTIME, Disp: 270 tablet, Rfl: 0 Medication Side Effects: none  Family Medical/ Social History: Changes? No  MENTAL HEALTH EXAM:  There were no vitals taken for this visit.There is no height or weight on file to calculate BMI.  General Appearance: Casual, Neat and Well Groomed  Eye Contact:  Good  Speech:  Clear and Coherent and Normal Rate  Volume:  Normal  Mood:  Euthymic  Affect:  Appropriate  Thought Process:  Goal Directed and Descriptions of Associations: Intact  Orientation:  Full (Time, Place, and Person)  Thought Content: Logical   Suicidal Thoughts:  No  Homicidal Thoughts:  No  Memory:  WNL  Judgement:  Good  Insight:  Good  Psychomotor Activity:  Normal  Concentration:  Concentration: Good and Attention Span: Good  Recall:  Good  Fund of Knowledge: Good  Language: Good  Assets:  Desire for Improvement  ADL's:  Intact  Cognition: WNL  Prognosis:  Good    DIAGNOSES:    ICD-10-CM   1. Generalized anxiety disorder  F41.1   2. Major depression, recurrent, full remission (HCC)  F33.42   3. Grief  F43.21     Receiving Psychotherapy:  No   RECOMMENDATIONS: PDMP was reviewed. I provided 20 minutes of non-face-to-face time during this encounter, discussing her diagnosis and response to medications. She is doing very well overall so no changes will be made. Continue Ativan 0.5 mg, 1/2-1 every 8 hours as needed anxiety. Continue Effexor 100 mg, 2  p.o. every morning and 1 p.o. nightly. Continue multivitamin, omega read, vitamin D, and B complex daily. Counseling as needed. Return in 6 months. Since the office is not open today due to inclement weather, she will call back in the next few days to schedule that appointment.  Melony Overly, PA-C

## 2020-03-25 NOTE — Progress Notes (Signed)
Cardiology Office Note:    Date:  03/26/2020   ID:  Alexis Ibarra, DOB 1965/09/02, MRN [id number]  PCP:  Alexis Pinto, FNP  Cardiologist:  Alexis Miss, MD  Electrophysiologist:  None   Referring MD: Alexis Pinto, FNP   Chief Complaint  Patient presents with  . Congestive Heart Failure  Takotsubo Syndrome  Previous notes:   Alexis Ibarra is a 55 y.o. female with a hx of Takotsubo syndrome.  Seen with husband, Alexis Ibarra.   She was admitted to the hospital several weeks ago.  She had minimal troponin levels.  Cardiac cath revealed very minimal coronary artery disease but she had left ventriculogram that was consistent with takotsubo syndrome.  We started Coreg Has had some fatigue for a few days but this has resolved.  Has not been exercising.  December 07, 2018: Alexis Ibarra seen today for follow-up visit.  She was hospitalized with chest pain and positive troponin levels.  She was thought to have Takotsubo  Syndrome.  Has had 1 episode of CP .  Took 1 NTG  No further CP  Has retired from work ( worked in the [employer] office )   Jan. 31, 2022 Alexis Ibarra is seen for follow up visit for her Takotsubo syndrome in 2019 Her last echo shows normal LV function  brought labs from urology visit  BMP, LFTs, were stable Chol =149 Trigs = 91 HDL = 63 LDL = 69   tsh is normal    Past Medical History:  Diagnosis Date  . Acne   . Anxiety   . Depression   . Takotsubo cardiomyopathy    a. NSTEMI 12/2017 -> cath with normal coronaries, preserved EF but apical ballooning suggestive of Takotsubo cardiomyopathy.    Past Surgical History:  Procedure Laterality Date  . LEFT HEART CATH AND CORONARY ANGIOGRAPHY N/A 01/19/2018   Procedure: LEFT HEART CATH AND CORONARY ANGIOGRAPHY;  Surgeon: Marykay Lex, MD;  Location: [facility] INVASIVE CV LAB;  Service: Cardiovascular;  Laterality: N/A;    Current Medications: Current Meds  Medication Sig  . aspirin 81 MG EC tablet Take 1 tablet (81 mg total) by mouth  daily.  Marland Kitchen b complex vitamins tablet Take 1 tablet by mouth daily.  . cholecalciferol (VITAMIN D3) 25 MCG (1000 UNIT) tablet Take 5,000 Units by mouth daily.  Marland Kitchen ESTRADIOL-PROGESTERONE PO Take by mouth.  Marland Kitchen LORazepam (ATIVAN) 0.5 MG tablet Take 0.5-1 tablets (0.25-0.5 mg total) by mouth every 8 (eight) hours as needed for anxiety.  Gifford Shave Omega-3 Krill Oil 500 MG CAPS Take 500 mg by mouth daily.  . minocycline (MINOCIN) 50 MG capsule Take 50 mg by mouth as needed.  . Multiple Vitamins-Minerals (MULTIVITAMIN) tablet Take 1 tablet by mouth daily.  Marland Kitchen venlafaxine (EFFEXOR) 100 MG tablet TAKE 2 TABLETS BY MOUTH EVERY DAY IN THE MORNING AND TAKE 1 TABLET AT BEDTIME  . [DISCONTINUED] carvedilol (COREG) 3.125 MG tablet TAKE 1 TABLET(3.125 MG) BY MOUTH TWICE DAILY WITH A MEAL  . [DISCONTINUED] losartan (COZAAR) 25 MG tablet Take 0.5 tablets (12.5 mg total) by mouth daily. Please keep upcoming appointment in Jan. 2022 for future refills. Thank you  . [DISCONTINUED] nitroGLYCERIN (NITROSTAT) 0.4 MG SL tablet Place 1 tablet (0.4 mg total) under the tongue every 5 (five) minutes as needed for chest pain (up to 3 doses).  . [DISCONTINUED] rosuvastatin (CRESTOR) 5 MG tablet TAKE 1 TABLET(5 MG) BY MOUTH EVERY EVENING     Allergies:   Patient has no known allergies.  Social History   Socioeconomic History  . Marital status: Married    Spouse name: Not on file  . Number of children: 1  . Years of education: Not on file  . Highest education level: Associate degree: academic program  Occupational History  . 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 Smoking status: Never Smoker  . Smokeless tobacco: Never Used  Substance and Sexual Activity  . Alcohol use: Not Currently  . Drug use: Not Currently  . Sexual activity: Not on file  Other Topics Concern  . Not on file  Social History Narrative   2nd marriage.  4 years.     Has a 56 yo  dtr from previous marriage. 2 grandsons.  2 stepdtrs.   Grew up with both parents in the home.  They've been married 54 years.  Has a younger brother.    Grew up in [place], [place] and still lives there.    Went to [employer].  Plans to retire in Oct. And will help out family.      Christian      No legal issues       Caffeine 1-2 coffee/day.    Social Determinants of Corporate investment banker Strain: Not on file  Food Insecurity: Not on file  Transportation Needs: Not on file  Physical Activity: Not on file  Stress: Not on file  Social Connections: Not on file     Family History: The patient's family history includes CAD in her maternal grandfather; Cirrhosis in her father; Dementia in her maternal grandmother and paternal grandmother; Depression in her daughter; Healthy in her brother; Heart failure in her paternal grandfather; Hypertension in her father; Hypothyroidism in her mother; Kidney disease in her father; Prostate cancer in her paternal grandfather.  ROS:   Please see the history of present illness.     All other systems reviewed and are negative.  EKGs/Labs/Other Studies Reviewed:    The following studies were reviewed today:     Recent Labs: No results found for requested labs within last 8760 hours.  Recent Lipid Panel    Component Value Date/Time   CHOL 144 12/07/2018 0947   TRIG 57 12/07/2018 0947   HDL 63 12/07/2018 0947   CHOLHDL 2.3 12/07/2018 0947   CHOLHDL 2.8 01/19/2018 1047   VLDL 8 01/19/2018 1047   LDLCALC 69 12/07/2018 0947    Physical Exam: Blood pressure 128/90, pulse 83, height 5' 2.5" (1.588 m), weight 147 lb 12.8 oz (67 kg), SpO2 99 %.  GEN:  Well nourished, well developed in no acute distress HEENT: Normal NECK: No JVD; No carotid bruits LYMPHATICS: No lymphadenopathy CARDIAC: RRR , no murmurs, rubs, gallops RESPIRATORY:  Clear to auscultation without rales, wheezing or rhonchi  ABDOMEN: Soft, non-tender,  non-distended MUSCULOSKELETAL:  No edema; No deformity  SKIN: Warm and dry NEUROLOGIC:  Alert and oriented x 3  EKG:   March 26, 2020: Normal sinus rhythm.  No ST or T wave changes.  ASSESSMENT:    1. Takotsubo cardiomyopathy   2. Coronary artery disease involving native coronary artery of native heart without angina pectoris    PLAN:     1.  Takotsubo syndrome:    Tere is doing well.  Cont current meds. No recurrent chest pain  Echo showed normal LV function    2.  Mild coronary artery disease:   She has a  mild - moderate stenosis in her R PDA vessel.  Cont lipid management   3.  Anxiety:  Managed by her primary MD .      I will see in a year.    Medication Adjustments/Labs and Tests Ordered: Current medicines are reviewed at length with the patient today.  Concerns regarding medicines are outlined above.  Orders Placed This Encounter  Procedures  . EKG 12-Lead   Meds ordered this encounter  Medications  . carvedilol (COREG) 3.125 MG tablet    Sig: TAKE 1 TABLET(3.125 MG) BY MOUTH TWICE DAILY WITH A MEAL    Dispense:  180 tablet    Refill:  3  . losartan (COZAAR) 25 MG tablet    Sig: Take 0.5 tablets (12.5 mg total) by mouth daily. Please keep upcoming appointment in Jan. 2022 for future refills. Thank you    Dispense:  45 tablet    Refill:  0  . rosuvastatin (CRESTOR) 5 MG tablet    Sig: TAKE 1 TABLET(5 MG) BY MOUTH EVERY EVENING    Dispense:  90 tablet    Refill:  3  . nitroGLYCERIN (NITROSTAT) 0.4 MG SL tablet    Sig: Place 1 tablet (0.4 mg total) under the tongue every 5 (five) minutes as needed for chest pain (up to 3 doses).    Dispense:  25 tablet    Refill:  6     Patient Instructions  Medication Instructions:  Your physician recommends that you continue on your current medications as directed. Please refer to the Current Medication list given to you today. *If you need a refill on your cardiac medications before your next appointment, please call  your pharmacy*   Lab Work: None today If you have labs (blood work) drawn today and your tests are completely normal, you will receive your results only by: Marland Kitchen MyChart Message (if you have MyChart) OR . A paper copy in the mail If you have any lab test that is abnormal or we need to change your treatment, we will call you to review the results.   Testing/Procedures: None ordered  Follow-Up: At [facility], you and your health needs are our priority.  As part of our continuing mission to provide you with exceptional heart care, we have created designated Provider Care Teams.  These Care Teams include your primary Cardiologist (physician) and Advanced Practice Providers (APPs -  Physician Assistants and Nurse Practitioners) who all work together to provide you with the care you need, when you need it.  Your next appointment:   1 year(s)  The format for your next appointment:   In Person  Provider:   Kristeen Miss, MD        Signed, Alexis Miss, MD  03/26/2020 8:46 AM    [facility]

## 2020-03-26 ENCOUNTER — Other Ambulatory Visit: Payer: Self-pay

## 2020-03-26 ENCOUNTER — Ambulatory Visit: Payer: BC Managed Care – PPO | Admitting: Cardiovascular Disease

## 2020-03-26 ENCOUNTER — Encounter: Payer: Self-pay | Admitting: Cardiovascular Disease

## 2020-03-26 VITALS — BP 128/90 | HR 83 | Ht 62.5 in | Wt 147.8 lb

## 2020-03-26 DIAGNOSIS — I5181 Takotsubo syndrome: Secondary | ICD-10-CM

## 2020-03-26 DIAGNOSIS — I2510 Atherosclerotic heart disease of native coronary artery without angina pectoris: Secondary | ICD-10-CM | POA: Diagnosis not present

## 2020-03-26 MED ORDER — CARVEDILOL 3.125 MG PO TABS: ORAL_TABLET | ORAL | 3 refills | Status: DC

## 2020-03-26 MED ORDER — LOSARTAN POTASSIUM 25 MG PO TABS
12.5000 mg | ORAL_TABLET | Freq: Every day | ORAL | 0 refills | Status: DC
Start: 2020-03-26 — End: 2020-07-25

## 2020-03-26 MED ORDER — NITROGLYCERIN 0.4 MG SL SUBL
0.4000 mg | SUBLINGUAL_TABLET | SUBLINGUAL | 6 refills | Status: AC | PRN
Start: 2020-03-26 — End: ?

## 2020-03-26 MED ORDER — ROSUVASTATIN CALCIUM 5 MG PO TABS: ORAL_TABLET | ORAL | 3 refills | Status: DC

## 2020-03-26 NOTE — Patient Instructions (Signed)
Medication Instructions:  Your physician recommends that you continue on your current medications as directed. Please refer to the Current Medication list given to you today. *If you need a refill on your cardiac medications before your next appointment, please call your pharmacy*   Lab Work: None today If you have labs (blood work) drawn today and your tests are completely normal, you will receive your results only by: Marland Kitchen MyChart Message (if you have MyChart) OR . A paper copy in the mail If you have any lab test that is abnormal or we need to change your treatment, we will call you to review the results.   Testing/Procedures: None ordered  Follow-Up: At [facility], you and your health needs are our priority.  As part of our continuing mission to provide you with exceptional heart care, we have created designated Provider Care Teams.  These Care Teams include your primary Cardiologist (physician) and Advanced Practice Providers (APPs -  Physician Assistants and Nurse Practitioners) who all work together to provide you with the care you need, when you need it.  Your next appointment:   1 year(s)  The format for your next appointment:   In Person  Provider:   Kristeen Miss, MD

## 2020-05-14 ENCOUNTER — Ambulatory Visit: Payer: Self-pay | Admitting: Physician Assistant

## 2020-07-24 ENCOUNTER — Other Ambulatory Visit: Payer: Self-pay | Admitting: Cardiovascular Disease

## 2020-08-06 ENCOUNTER — Encounter: Payer: Self-pay | Admitting: Gastroenterology

## 2020-08-07 ENCOUNTER — Encounter: Payer: Self-pay | Admitting: Gastroenterology

## 2020-09-10 ENCOUNTER — Ambulatory Visit (INDEPENDENT_AMBULATORY_CARE_PROVIDER_SITE_OTHER): Payer: BC Managed Care – PPO | Admitting: Physician Assistant

## 2020-09-10 ENCOUNTER — Encounter: Payer: Self-pay | Admitting: Physician Assistant

## 2020-09-10 ENCOUNTER — Other Ambulatory Visit: Payer: Self-pay

## 2020-09-10 DIAGNOSIS — F411 Generalized anxiety disorder: Secondary | ICD-10-CM

## 2020-09-10 DIAGNOSIS — F331 Major depressive disorder, recurrent, moderate: Secondary | ICD-10-CM | POA: Diagnosis not present

## 2020-09-10 DIAGNOSIS — R5383 Other fatigue: Secondary | ICD-10-CM | POA: Diagnosis not present

## 2020-09-10 MED ORDER — BUPROPION HCL ER (XL) 150 MG PO TB24: 150.0000 mg | ORAL_TABLET | ORAL | 1 refills | Status: DC

## 2020-09-10 NOTE — Progress Notes (Signed)
[facility]  Patient ID: Alexis Ibarra,  MRN: [id number]  PCP: Audie Pinto, FNP  Date of Evaluation: 09/10/2020 Time spent:30 minutes  Chief Complaint:  Chief Complaint   Follow-up; Anxiety       HISTORY/CURRENT STATUS: For 6 month med check.  Feels kind of blah. Just going through the motions. Does do things like go to the pool when her grandkids want her to, but she has no drive/motivation. Not a lot different than before, in past year or more. Doesn't feel depressed, but does cry sometimes when warranted. Hard to describe the feeling. Low energy. She retired in 2019 and continues to keep her grandkids and her nephew. Sleeps ok. No SI/HI.  Patient denies increased energy with decreased need for sleep, no increased talkativeness, no racing thoughts, no impulsivity or risky behaviors, no increased spending, no increased libido, no grandiosity, no increased irritability or anger, no paranoia, and no hallucinations.  Hasn't used Ativan but once since it was Rx. Only when she has blood drawn.  She is due to have blood work drawn in a couple of months so she will need to take it then.  She will have someone drive her.  She always has a vasovagal response and the Ativan at least helps.  Denies dizziness, syncope, seizures, numbness, tingling, tremor, tics, unsteady gait, slurred speech, confusion. Denies muscle or joint pain, stiffness, or dystonia.  Individual Medical History/ Review of Systems: Changes? :Yes  had covid a few weeks ago.   Past medications for mental health diagnoses include: Celexa, Wellbutrin, Lexapro, Ativan  Allergies: Patient has no known allergies.  Current Medications:  Current Outpatient Medications:    aspirin 81 MG EC tablet, Take 1 tablet (81 mg total) by mouth daily., Disp: 30 tablet, Rfl: 6   b complex vitamins tablet, Take 1 tablet by mouth daily., Disp: 30 tablet, Rfl: 11   buPROPion (WELLBUTRIN XL) 150 MG 24 hr tablet, Take 1 tablet (150  mg total) by mouth every morning., Disp: 30 tablet, Rfl: 1   carvedilol (COREG) 3.125 MG tablet, TAKE 1 TABLET(3.125 MG) BY MOUTH TWICE DAILY WITH A MEAL, Disp: 180 tablet, Rfl: 3   cholecalciferol (VITAMIN D3) 25 MCG (1000 UNIT) tablet, Take 5,000 Units by mouth daily., Disp: , Rfl:    ESTRADIOL-PROGESTERONE PO, Take by mouth., Disp: , Rfl:    losartan (COZAAR) 25 MG tablet, Take 0.5 tablets (12.5 mg total) by mouth daily., Disp: 45 tablet, Rfl: 3   Multiple Vitamins-Minerals (MULTIVITAMIN) tablet, Take 1 tablet by mouth daily., Disp: 30 tablet, Rfl: 11   rosuvastatin (CRESTOR) 5 MG tablet, TAKE 1 TABLET(5 MG) BY MOUTH EVERY EVENING, Disp: 90 tablet, Rfl: 3   venlafaxine (EFFEXOR) 100 MG tablet, TAKE 2 TABLETS BY MOUTH EVERY DAY IN THE MORNING AND TAKE 1 TABLET AT BEDTIME, Disp: 270 tablet, Rfl: 1   LORazepam (ATIVAN) 0.5 MG tablet, Take 0.5-1 tablets (0.25-0.5 mg total) by mouth every 8 (eight) hours as needed for anxiety. (Patient not taking: Reported on 09/10/2020), Disp: 30 tablet, Rfl: 0   MegaRed Omega-3 Krill Oil 500 MG CAPS, Take 500 mg by mouth daily. (Patient not taking: Reported on 09/10/2020), Disp: 30 capsule, Rfl: 11   nitroGLYCERIN (NITROSTAT) 0.4 MG SL tablet, Place 1 tablet (0.4 mg total) under the tongue every 5 (five) minutes as needed for chest pain (up to 3 doses). (Patient not taking: Reported on 09/10/2020), Disp: 25 tablet, Rfl: 6 Medication Side Effects: none  Family Medical/ Social History: Changes? No  MENTAL HEALTH EXAM:  There were no vitals taken for this visit.There is no height or weight on file to calculate BMI.  General Appearance: Casual, Neat and Well Groomed  Eye Contact:  Good  Speech:  Clear and Coherent and Normal Rate  Volume:  Normal  Mood:  Depressed  Affect:  Depressed and Flat  Thought Process:  Goal Directed and Descriptions of Associations: Intact  Orientation:  Full (Time, Place, and Person)  Thought Content: Logical   Suicidal Thoughts:  No   Homicidal Thoughts:  No  Memory:  WNL  Judgement:  Good  Insight:  Good  Psychomotor Activity:  Normal  Concentration:  Concentration: Good and Attention Span: Good  Recall:  Good  Fund of Knowledge: Good  Language: Good  Assets:  Desire for Improvement  ADL's:  Intact  Cognition: WNL  Prognosis:  Good    DIAGNOSES:    ICD-10-CM   1. Major depressive disorder, recurrent episode, moderate (HCC)  F33.1     2. Fatigue, unspecified type  R53.83     3. Generalized anxiety disorder  F41.1       Receiving Psychotherapy:  No   RECOMMENDATIONS: PDMP was reviewed, unable to see.  I provided 30 minutes of face to face time during this encounter, including time spent before and after the visit in records review, medical decision making, and charting.  We discussed the melancholy depression.  Recommend adding Wellbutrin because it will help with energy and motivation.  She has taken it before but does not remember when or why it was stopped.  Discussed potential side effects and she accepts. Also recommend having labs to check for anemia, hypothyroidism, low B12 or folate, low vitamin D.  Since she is having labs drawn within the next month or so she can have those done at her PCP and have the results sent to me. Start Wellbutrin XL 150 mg, 1 p.o. every morning. Continue Ativan 0.5 mg, 1-2 every 8 hours as needed anxiety. Continue Effexor 100 mg, 2 p.o. every morning and 1 p.o. nightly. Continue multivitamin, omega read, vitamin D, and B complex daily. Return in 6 to 8 weeks.  Melony Overly, PA-C

## 2020-10-02 ENCOUNTER — Other Ambulatory Visit: Payer: Self-pay | Admitting: Physician Assistant

## 2020-10-22 ENCOUNTER — Other Ambulatory Visit: Payer: Self-pay | Admitting: Physician Assistant

## 2020-10-22 ENCOUNTER — Ambulatory Visit: Payer: BC Managed Care – PPO | Admitting: Gastroenterology

## 2020-10-30 ENCOUNTER — Other Ambulatory Visit: Payer: Self-pay | Admitting: Physician Assistant

## 2020-11-05 ENCOUNTER — Ambulatory Visit: Payer: BC Managed Care – PPO | Admitting: Physician Assistant

## 2020-11-05 ENCOUNTER — Encounter: Payer: Self-pay | Admitting: Physician Assistant

## 2020-11-05 ENCOUNTER — Other Ambulatory Visit: Payer: Self-pay

## 2020-11-05 DIAGNOSIS — R5383 Other fatigue: Secondary | ICD-10-CM | POA: Diagnosis not present

## 2020-11-05 DIAGNOSIS — F418 Other specified anxiety disorders: Secondary | ICD-10-CM | POA: Diagnosis not present

## 2020-11-05 DIAGNOSIS — F32A Depression, unspecified: Secondary | ICD-10-CM | POA: Diagnosis not present

## 2020-11-05 NOTE — Progress Notes (Signed)
[facility]  Patient ID: Alexis Ibarra,  MRN: [id number]  PCP: Audie Pinto, FNP  Date of Evaluation: 11/05/2020 Time spent:20 minutes  Chief Complaint:  Chief Complaint   Depression        HISTORY/CURRENT STATUS: For 6 month med check.  At LOV in July, Wellbutrin was added. It has helped with energy.  States if it was not for her family problems she would be doing really well.  She is able to enjoy things.  Motivation and energy are both better.  She does cry easily but that is her nature.  Sleeps well.  Rarely takes the Ativan, it usually only for labs, she gets extremely anxious when she has to have blood drawn.  Denies suicidal or homicidal thoughts.    She is under a lot of stress, her daughter and son-in-law are having problems, still living together. Her 63 yo grandson wants to be with her all the time. He's in counseling now. Patient is doing everything in her power to help him understand it's ok to go home and spend time with his other grandparents.  No concerns of abuse at all, he just wants to be around her all the time.  Denies dizziness, syncope, seizures, numbness, tingling, tremor, tics, unsteady gait, slurred speech, confusion. Denies muscle or joint pain, stiffness, or dystonia.  Individual Medical History/ Review of Systems: Changes? :No    Past medications for mental health diagnoses include: Celexa, Wellbutrin, Lexapro, Ativan  Allergies: Patient has no known allergies.  Current Medications:  Current Outpatient Medications:    aspirin 81 MG EC tablet, Take 1 tablet (81 mg total) by mouth daily., Disp: 30 tablet, Rfl: 6   b complex vitamins tablet, Take 1 tablet by mouth daily., Disp: 30 tablet, Rfl: 11   buPROPion (WELLBUTRIN XL) 150 MG 24 hr tablet, TAKE 1 TABLET BY MOUTH EVERY DAY IN THE MORNING, Disp: 90 tablet, Rfl: 0   carvedilol (COREG) 3.125 MG tablet, TAKE 1 TABLET(3.125 MG) BY MOUTH TWICE DAILY WITH A MEAL, Disp: 180 tablet, Rfl: 3    cholecalciferol (VITAMIN D3) 25 MCG (1000 UNIT) tablet, Take 5,000 Units by mouth daily., Disp: , Rfl:    ESTRADIOL-PROGESTERONE PO, Take by mouth., Disp: , Rfl:    LORazepam (ATIVAN) 0.5 MG tablet, Take 0.5-1 tablets (0.25-0.5 mg total) by mouth every 8 (eight) hours as needed for anxiety., Disp: 30 tablet, Rfl: 0   losartan (COZAAR) 25 MG tablet, Take 0.5 tablets (12.5 mg total) by mouth daily., Disp: 45 tablet, Rfl: 3   MegaRed Omega-3 Krill Oil 500 MG CAPS, Take 500 mg by mouth daily., Disp: 30 capsule, Rfl: 11   nitroGLYCERIN (NITROSTAT) 0.4 MG SL tablet, Place 1 tablet (0.4 mg total) under the tongue every 5 (five) minutes as needed for chest pain (up to 3 doses)., Disp: 25 tablet, Rfl: 6   rosuvastatin (CRESTOR) 5 MG tablet, TAKE 1 TABLET(5 MG) BY MOUTH EVERY EVENING, Disp: 90 tablet, Rfl: 3   venlafaxine (EFFEXOR) 100 MG tablet, TAKE 2 TABLETS BY MOUTH EVERY DAY IN THE MORNING AND TAKE 1 TABLET AT BEDTIME, Disp: 270 tablet, Rfl: 0   Multiple Vitamins-Minerals (MULTIVITAMIN) tablet, Take 1 tablet by mouth daily. (Patient not taking: Reported on 11/05/2020), Disp: 30 tablet, Rfl: 11 Medication Side Effects: none  Family Medical/ Social History: Changes? No  MENTAL HEALTH EXAM:  There were no vitals taken for this visit.There is no height or weight on file to calculate BMI.  General Appearance: Casual, Neat and  Well Groomed  Eye Contact:  Good  Speech:  Clear and Coherent and Normal Rate  Volume:  Normal  Mood:  Depressed  Affect:  Depressed and Flat  Thought Process:  Goal Directed and Descriptions of Associations: Intact  Orientation:  Full (Time, Place, and Person)  Thought Content: Logical   Suicidal Thoughts:  No  Homicidal Thoughts:  No  Memory:  WNL  Judgement:  Good  Insight:  Good  Psychomotor Activity:  Normal  Concentration:  Concentration: Good and Attention Span: Good  Recall:  Good  Fund of Knowledge: Good  Language: Good  Assets:  Desire for Improvement  ADL's:   Intact  Cognition: WNL  Prognosis:  Good   Labs at [facility] 10/08/2020 CBC WBC 6.4, Hgb 13.6, Hct 40.7, plts 329 CMP Glu 87, kidney functions are nl, electrolytes are nl, LFTs.  Vitamin B-12 high >1000 (she d/c extra Vit B12 supplement) Folate 8.7  Vit D 83 Lipids TC 129, Trig 87, HDL 58, LDL 54 Estradiol 15.6 Progesterone 0.4 TSH 2.2 DHEA 89.1   DIAGNOSES:    ICD-10-CM   1. Depression, unspecified depression type  F32.A     2. Fatigue, unspecified type  R53.83     3. Situational anxiety  F41.8        Receiving Psychotherapy:  No   RECOMMENDATIONS: PDMP was reviewed.  No recent controlled substances.  Last lorazepam 12/12/2019. I provided 20 minutes of face to face time during this encounter, including time spent before and after the visit in records review, medical decision making, and charting.  I am glad she is doing better as far as the medications go.  I do recommend she get back in counseling.  And it may be appropriate for her to talk to her grandson's counselor, if her daughter and son-in-law agree. Continue Wellbutrin XL 150 mg, 1 p.o. every morning. Continue Ativan 0.5 mg, 1-2 every 8 hours as needed anxiety. Continue Effexor 100 mg, 2 p.o. every morning and 1 p.o. nightly. Continue multivitamin, omega red, vitamin D, and B complex daily. Return in 4 months.  Melony Overly, PA-C

## 2020-12-24 ENCOUNTER — Ambulatory Visit (INDEPENDENT_AMBULATORY_CARE_PROVIDER_SITE_OTHER): Payer: BC Managed Care – PPO | Admitting: Gastroenterology

## 2020-12-24 ENCOUNTER — Other Ambulatory Visit: Payer: Self-pay

## 2020-12-24 ENCOUNTER — Encounter: Payer: Self-pay | Admitting: Gastroenterology

## 2020-12-24 VITALS — BP 118/78 | HR 90 | Ht 62.5 in | Wt 144.4 lb

## 2020-12-24 DIAGNOSIS — Z8371 Family history of colonic polyps: Secondary | ICD-10-CM | POA: Diagnosis not present

## 2020-12-24 MED ORDER — CLENPIQ 10-3.5-12 MG-GM -GM/160ML PO SOLN: 1.0000 | ORAL | 0 refills | Status: DC

## 2020-12-24 NOTE — Progress Notes (Signed)
Chief Complaint: for colon  Referring Provider:  Audie Pinto, FNP      ASSESSMENT AND PLAN;   #1. FH polyps (dad < age 55).  Plan: -Colon for further eval   Discussed risks & benefits of colonoscopy. Risks including rare perforation req laparotomy, bleeding after bx/polypectomy req blood transfusion, rarely missing neoplasms, risks of anesthesia/sedation, rare risk of damage to internal organs. Benefits outweigh the risks. Patient agrees to proceed. All the questions were answered. Pt consents to proceed.   HPI:    Alexis Ibarra is a 55 y.o. female  With H/O Takotsubo  Syndrome (now with EF 60-65%), anxiety  No nausea, vomiting, heartburn, regurgitation, odynophagia or dysphagia.  No significant diarrhea or constipation.  No melena or hematochezia. No unintentional weight loss. No abdominal pain.  Dad had significant polyps (8) below 60.  Nl CBC with Hb 13.6  with Nl CMP 09/2020  Note that patient is "petrified about IV line".  I have told her to take Ativan prior.    SH: Husband is attorney  Past GI history Colonoscopy 08/2014 ([facility]): Moderate predominantly sigmoid diverticulosis, otherwise normal to TI.  Repeat in 5 years. Past Medical History:  Diagnosis Date   Acne    Anxiety    Depression    Hematochezia    Takotsubo cardiomyopathy    a. NSTEMI 12/2017 -> cath with normal coronaries, preserved EF but apical ballooning suggestive of Takotsubo cardiomyopathy.    Past Surgical History:  Procedure Laterality Date   COLONOSCOPY  09/21/2014   Moderate predominantly sigmoid diverticulosis. Otherwise normal colonoscopy   LEFT HEART CATH AND CORONARY ANGIOGRAPHY N/A 01/19/2018   Procedure: LEFT HEART CATH AND CORONARY ANGIOGRAPHY;  Surgeon: Marykay Lex, MD;  Location: [facility] INVASIVE CV [facility];  Service: Cardiovascular;  Laterality: N/A;    Family History  Problem Relation Age of Onset   Hypothyroidism Mother    Colonic polyp Father    Hypertension Father     Kidney disease Father    Cirrhosis Father        non alcoholic   Colon polyps Father        precancerous   Liver cancer Father    Healthy Brother    Dementia Maternal Grandmother    CAD Maternal Grandfather    Dementia Paternal Grandmother    Heart failure Paternal Grandfather    Prostate cancer Paternal Grandfather    Depression Daughter    Colon cancer Neg Hx    Pancreatic cancer Neg Hx    Rectal cancer Neg Hx    Stomach cancer Neg Hx    Esophageal cancer Neg Hx     Social History   Tobacco Use   Smoking status: Never   Smokeless tobacco: Never  Vaping Use   Vaping Use: Never used  Substance Use Topics   Alcohol use: Not Currently   Drug use: Never    Current Outpatient Medications  Medication Sig Dispense Refill   aspirin 81 MG EC tablet Take 1 tablet (81 mg total) by mouth daily. 30 tablet 6   b complex vitamins tablet Take 1 tablet by mouth daily. 30 tablet 11   buPROPion (WELLBUTRIN XL) 150 MG 24 hr tablet TAKE 1 TABLET BY MOUTH EVERY DAY IN THE MORNING 90 tablet 0   carvedilol (COREG) 3.125 MG tablet TAKE 1 TABLET(3.125 MG) BY MOUTH TWICE DAILY WITH A MEAL 180 tablet 3   cholecalciferol (VITAMIN D3) 25 MCG (1000 UNIT) tablet Take 5,000 Units by mouth daily.  ESTRADIOL-PROGESTERONE PO Take by mouth.     LORazepam (ATIVAN) 0.5 MG tablet Take 0.5-1 tablets (0.25-0.5 mg total) by mouth every 8 (eight) hours as needed for anxiety. 30 tablet 0   losartan (COZAAR) 25 MG tablet Take 0.5 tablets (12.5 mg total) by mouth daily. 45 tablet 3   MegaRed Omega-3 Krill Oil 500 MG CAPS Take 500 mg by mouth daily. 30 capsule 11   Multiple Vitamins-Minerals (MULTIVITAMIN) tablet Take 1 tablet by mouth daily. 30 tablet 11   nitroGLYCERIN (NITROSTAT) 0.4 MG SL tablet Place 1 tablet (0.4 mg total) under the tongue every 5 (five) minutes as needed for chest pain (up to 3 doses). 25 tablet 6   rosuvastatin (CRESTOR) 5 MG tablet TAKE 1 TABLET(5 MG) BY MOUTH EVERY EVENING 90 tablet 3    venlafaxine (EFFEXOR) 100 MG tablet TAKE 2 TABLETS BY MOUTH EVERY DAY IN THE MORNING AND TAKE 1 TABLET AT BEDTIME 270 tablet 0   No current facility-administered medications for this visit.    No Known Allergies  Review of Systems:  Constitutional: Denies fever, chills, diaphoresis, appetite change and fatigue.  HEENT: Denies photophobia, eye pain, redness, hearing loss, ear pain, congestion, sore throat, rhinorrhea, sneezing, mouth sores, neck pain, neck stiffness and tinnitus.   Respiratory: Denies SOB, DOE, cough, chest tightness,  and wheezing.   Cardiovascular: Denies chest pain, palpitations and leg swelling.  Genitourinary: Denies dysuria, urgency, frequency, hematuria, flank pain and difficulty urinating.  Musculoskeletal: Denies myalgias, back pain, joint swelling, arthralgias and gait problem.  Skin: No rash.  Neurological: Denies dizziness, seizures, syncope, weakness, light-headedness, numbness and headaches.  Hematological: Denies adenopathy. Easy bruising, personal or family bleeding history  Psychiatric/Behavioral: Has anxiety or depression     Physical Exam:    BP 118/78   Pulse 90   Ht 5' 2.5" (1.588 m)   Wt 144 lb 6 oz (65.5 kg)   SpO2 99%   BMI 25.99 kg/m  Wt Readings from Last 3 Encounters:  12/24/20 144 lb 6 oz (65.5 kg)  03/26/20 147 lb 12.8 oz (67 kg)  08/02/19 147 lb (66.7 kg)   Constitutional:  Well-developed, in no acute distress. Psychiatric: Normal mood and affect. Behavior is normal. HEENT: Pupils normal.  Conjunctivae are normal. No scleral icterus. Cardiovascular: Normal rate, regular rhythm. No edema Pulmonary/chest: Effort normal and breath sounds normal. No wheezing, rales or rhonchi. Abdominal: Soft, nondistended. Nontender. Bowel sounds active throughout. There are no masses palpable. No hepatomegaly. Rectal: Deferred Neurological: Alert and oriented to person place and time. Skin: Skin is warm and dry. No rashes noted.  Data Reviewed:  I have personally reviewed following labs and imaging studies  CBC: CBC Latest Ref Rng & Units 01/20/2018 01/19/2018  WBC 4.0 - 10.5 K/uL 5.0 10.1  Hemoglobin 12.0 - 15.0 g/dL 13.4 14.1  Hematocrit 36.0 - 46.0 % 40.2 43.1  Platelets 150 - 400 K/uL 269 359    CMP: CMP Latest Ref Rng & Units 12/07/2018 01/27/2018 01/20/2018  Glucose 65 - 99 mg/dL 84 80 77  BUN 6 - 24 mg/dL 15 17 9   Creatinine 0.57 - 1.00 mg/dL 0.82 0.82 0.87  Sodium 134 - 144 mmol/L 140 138 139  Potassium 3.5 - 5.2 mmol/L 4.9 4.5 4.4  Chloride 96 - 106 mmol/L 104 101 110  CO2 20 - 29 mmol/L 25 20 23   Calcium 8.7 - 10.2 mg/dL 9.1 9.4 8.6(L)  Total Protein 6.0 - 8.5 g/dL 7.0 - -  Total Bilirubin 0.0 - 1.2  mg/dL 0.4 - -  Alkaline Phos 39 - 117 IU/L 53 - -  AST 0 - 40 IU/L 21 - -  ALT 0 - 32 IU/L 24 - -        Carmell Austria, MD 12/24/2020, 8:51 AM  Cc: Gweneth Fritter, FNP

## 2020-12-24 NOTE — Patient Instructions (Signed)
If you are age 56 or older, your body mass index should be between 23-30. Your Body mass index is 25.99 kg/m. If this is out of the aforementioned range listed, please consider follow up with your Primary Care Provider.  If you are age 41 or younger, your body mass index should be between 19-25. Your Body mass index is 25.99 kg/m. If this is out of the aformentioned range listed, please consider follow up with your Primary Care Provider.   __________________________________________________________  The [facility] providers would like to encourage you to use [facility] to communicate with providers for non-urgent requests or questions.  Due to long hold times on the telephone, sending your provider a message by [facility] may be a faster and more efficient way to get a response.  Please allow 48 business hours for a response.  Please remember that this is for non-urgent requests.   Due to recent changes in healthcare laws, you may see the results of your imaging and laboratory studies on MyChart before your provider has had a chance to review them.  We understand that in some cases there may be results that are confusing or concerning to you. Not all laboratory results come back in the same time frame and the provider may be waiting for multiple results in order to interpret others.  Please give [place] 48 hours in order for your provider to thoroughly review all the results before contacting the office for clarification of your results. We have sent the following medications to your pharmacy for you to pick up at your convenience:  We have sent the following medications to your pharmacy for you to pick up at your convenience:  Clenpiq for colonoscopy  It was a pleasure to see you today!  Lynann Bologna, M.D.

## 2021-01-19 ENCOUNTER — Other Ambulatory Visit: Payer: Self-pay | Admitting: Physician Assistant

## 2021-01-20 ENCOUNTER — Other Ambulatory Visit: Payer: Self-pay | Admitting: Physician Assistant

## 2021-01-28 ENCOUNTER — Encounter: Payer: Self-pay | Admitting: Gastroenterology

## 2021-02-04 ENCOUNTER — Other Ambulatory Visit: Payer: Self-pay

## 2021-02-04 ENCOUNTER — Ambulatory Visit: Payer: BC Managed Care – PPO | Admitting: Psychiatry

## 2021-02-04 DIAGNOSIS — F32A Depression, unspecified: Secondary | ICD-10-CM | POA: Diagnosis not present

## 2021-02-04 NOTE — Progress Notes (Signed)
[facility] Counselor/Therapist Progress Note  Patient ID: Lennix Rotundo, MRN: [id number],    Date: 02/04/2021  Time Spent: 58 minutes   Treatment Type: Individual Therapy  Reported Symptoms: depressed, anxiety  Mental Status Exam:  Appearance:   Casual     Behavior:  Appropriate, Sharing, and Motivated  Motor:  Normal  Speech/Language:   Clear and Coherent  Affect:  Depressed and anxious  Mood:  anxious and depressed  Thought process:  goal directed  Thought content:    WNL  Sensory/Perceptual disturbances:    WNL  Orientation:  oriented to person, place, time/date, situation, day of week, month of year, year, and stated date of Dec. 12, 2022  Attention:  Good  Concentration:  Good  Memory:  WNL  Fund of knowledge:   Good  Insight:    Good  Judgment:   Good  Impulse Control:  Good   Risk Assessment: Danger to Self:  No Self-injurious Behavior: No Danger to Others: No Duty to Warn:no Physical Aggression / Violence:No  Access to Firearms a concern: No  Gang Involvement:No   Subjective:  Patient in today due to anxiety, sadness, unresolved grief, and depression in reference to death of her father Jun 18, 2018) and some residual issues from that. Saw patient previously prior and during her father's illness and death (Stage 4 kidney disease). Today tearfully explained some of her thoughts/feelings which we looked at as there seemed to be a lot of assumptions being made without evidence to back it up.  Family issue complicating her situation. Issues from past for patient arising again and tendency for her to make assumptions that are not reality-based. Some unresolved grief contributing to her current distress as well.  Assuming the worst is contributing to her worrying. "I worry about so many things", but doesn't "seem to feet it's an unhelpful thing to worry." Discussed worrying and it's negative aspects with patient and she shares worrying is a long term trait to her and crying  often about family situation.  Shared several examples in session regarding thought anxious/negative thought interruption and replacement with more reality based and empowering thoughts.  Encouraged patient to try practicing the sound between appointments, as well as some other more positive behaviors that can be supportive to her in between sessions and noted below in "Plan" section. Is going to check in with her med provider. Denies any SI.   Interventions: Solution-Oriented/Positive Psychology, Ego-Supportive, and Insight-Oriented   Plan of Care: Patient not signing tx plan on computer screen due to COVID. Treatment Goals: Goals remain on tx plan while patient works on strategies to reach her goals.  Progress is documented each session in "Progress" section of Plan. Long term goal: Reduce overall level, frequency, and intensity of anxiety so that daily functioning is not impaired. Short term goal: Increase understanding of beliefs and messages that produce worry and anxiety. Strategy: 1.Explore cognitive messages that generate anxiety and retrain in adaptive cognitions. 2.Develop behavioral and cognitive strategies to reduce or eliminate irrational anxiety.   Diagnosis:   ICD-10-CM   1. Depression, unspecified depression type  F32.A       Plan:    Patient today showing good motivation and active participation in session as she worked on depression, sadness, unresolved grief over dad's death, negative assumptions, and anxiety regarding multiple family issues involving a death and some decision-making within family.  Patient encouraged in her practice of more positive behaviors including: Believing in herself and her ability to make some  necessary changes in her thinking and behavior, staying in the present focusing on what she can control or change, getting outside daily and walking as she is able, staying in touch with people who are supportive, look for more positives versus negatives  daily, remain on her prescribed medication, healthy nutrition and exercise, being open to other ways of looking at issues, stop assuming worst case scenarios, practice consistent positive self talk, practice more intentional listening to others to really hear them before responding, reduce overthinking and over analyzing, consider that sometimes she does not fully understand what someone else says or maybe took it the wrong way, challenge and counteract her self doubt, interrupt negative/anxious thoughts to challenge and replace them with more reality-based thoughts, saying no without feeling guilty, letting go of things including guilt from the past that hold her back, be willing to work on some "control" issues, and recognize the strengths she shows working with goal-directed behaviors to move in a direction that supports improved emotional health.  Goal review and progress/challenges noted with patient.  Next appointment within 2 to 3 weeks.  This record has been created using [employer].  Chart creation errors have been sought, but may not always have been located and corrected.  Such creation errors do not reflect on the standard of medical care provided.   Mathis Fare, LCSW

## 2021-02-10 ENCOUNTER — Encounter: Payer: Self-pay | Admitting: Certified Registered Nurse Anesthetist

## 2021-02-11 ENCOUNTER — Ambulatory Visit (AMBULATORY_SURGERY_CENTER): Payer: BC Managed Care – PPO | Admitting: Gastroenterology

## 2021-02-11 ENCOUNTER — Encounter: Payer: Self-pay | Admitting: Gastroenterology

## 2021-02-11 VITALS — BP 119/65 | HR 71 | Temp 97.7°F | Resp 13 | Ht 62.0 in | Wt 144.0 lb

## 2021-02-11 DIAGNOSIS — Z8371 Family history of colonic polyps: Secondary | ICD-10-CM | POA: Diagnosis present

## 2021-02-11 MED ORDER — SODIUM CHLORIDE 0.9 % IV SOLN: 500.0000 mL | INTRAVENOUS | Status: DC

## 2021-02-11 NOTE — Progress Notes (Signed)
Pt's states no medical or surgical changes since previsit or office visit. 

## 2021-02-11 NOTE — Patient Instructions (Signed)
Handouts Provided:  Polyps and Diverticulosis ° °YOU HAD AN ENDOSCOPIC PROCEDURE TODAY AT THE [facility]:   Refer to the procedure report that was given to you for any specific questions about what was found during the examination.  If the procedure report does not answer your questions, please call your gastroenterologist to clarify.  If you requested that your care partner not be given the details of your procedure findings, then the procedure report has been included in a sealed envelope for you to review at your convenience later. ° °YOU SHOULD EXPECT: Some feelings of bloating in the abdomen. Passage of more gas than usual.  Walking can help get rid of the air that was put into your GI tract during the procedure and reduce the bloating. If you had a lower endoscopy (such as a colonoscopy or flexible sigmoidoscopy) you may notice spotting of blood in your stool or on the toilet paper. If you underwent a bowel prep for your procedure, you may not have a normal bowel movement for a few days. ° °Please Note:  You might notice some irritation and congestion in your nose or some drainage.  This is from the oxygen used during your procedure.  There is no need for concern and it should clear up in a day or so. ° °SYMPTOMS TO REPORT IMMEDIATELY: ° °Following lower endoscopy (colonoscopy or flexible sigmoidoscopy): ° Excessive amounts of blood in the stool ° Significant tenderness or worsening of abdominal pains ° Swelling of the abdomen that is new, acute ° Fever of 100°F or higher ° °For urgent or emergent issues, a gastroenterologist can be reached at any hour by calling [phone]. °Do not use MyChart messaging for urgent concerns.  ° ° °DIET:  We do recommend a small meal at first, but then you may proceed to your regular diet.  Drink plenty of fluids but you should avoid alcoholic beverages for 24 hours. ° °ACTIVITY:  You should plan to take it easy for the rest of today and you should NOT DRIVE  or use heavy machinery until tomorrow (because of the sedation medicines used during the test).   ° °FOLLOW UP: °Our staff will call the number listed on your records 48-72 hours following your procedure to check on you and address any questions or concerns that you may have regarding the information given to you following your procedure. If we do not reach you, we will leave a message.  We will attempt to reach you two times.  During this call, we will ask if you have developed any symptoms of COVID 19. If you develop any symptoms (ie: fever, flu-like symptoms, shortness of breath, cough etc.) before then, please call [phone].  If you test positive for Covid 19 in the 2 weeks post procedure, please call and report this information to us.   ° °If any biopsies were taken you will be contacted by phone or by letter within the next 1-3 weeks.  Please call us at [phone] if you have not heard about the biopsies in 3 weeks.  ° ° °SIGNATURES/CONFIDENTIALITY: °You and/or your care partner have signed paperwork which will be entered into your electronic medical record.  These signatures attest to the fact that that the information above on your After Visit Summary has been reviewed and is understood.  Full responsibility of the confidentiality of this discharge information lies with you and/or your care-partner. ° °

## 2021-02-11 NOTE — Progress Notes (Signed)
Chief Complaint: for colon  Referring Provider:  Jackquline Denmark, MD      ASSESSMENT AND PLAN;   #1. FH polyps (dad < age 56).  Plan: -Colon for further eval   Discussed risks & benefits of colonoscopy. Risks including rare perforation req laparotomy, bleeding after bx/polypectomy req blood transfusion, rarely missing neoplasms, risks of anesthesia/sedation, rare risk of damage to internal organs. Benefits outweigh the risks. Patient agrees to proceed. All the questions were answered. Pt consents to proceed.   HPI:    Alexis Ibarra is a 55 y.o. female  With H/O Takotsubo  Syndrome (now with EF 60-65%), anxiety  No nausea, vomiting, heartburn, regurgitation, odynophagia or dysphagia.  No significant diarrhea or constipation.  No melena or hematochezia. No unintentional weight loss. No abdominal pain.  Dad had significant polyps (8) below 41.  Nl CBC with Hb 13.6  with Nl CMP 09/2020  Note that patient is "petrified about IV line".  I have told her to take Ativan prior.    SH: Husband is attorney  Past GI history Colonoscopy 08/2014 ([facility]): Moderate predominantly sigmoid diverticulosis, otherwise normal to TI.  Repeat in 5 years. Past Medical History:  Diagnosis Date   Acne    Anxiety    Depression    Hematochezia    Takotsubo cardiomyopathy    a. NSTEMI 12/2017 -> cath with normal coronaries, preserved EF but apical ballooning suggestive of Takotsubo cardiomyopathy.    Past Surgical History:  Procedure Laterality Date   COLONOSCOPY  09/21/2014   Moderate predominantly sigmoid diverticulosis. Otherwise normal colonoscopy   LEFT HEART CATH AND CORONARY ANGIOGRAPHY N/A 01/19/2018   Procedure: LEFT HEART CATH AND CORONARY ANGIOGRAPHY;  Surgeon: Leonie Man, MD;  Location: [facility];  Service: Cardiovascular;  Laterality: N/A;    Family History  Problem Relation Age of Onset   Hypothyroidism Mother    Colonic polyp Father    Hypertension Father    Kidney  disease Father    Cirrhosis Father        non alcoholic   Colon polyps Father        precancerous   Liver cancer Father    Healthy Brother    Dementia Maternal Grandmother    CAD Maternal Grandfather    Dementia Paternal Grandmother    Heart failure Paternal Grandfather    Prostate cancer Paternal Grandfather    Depression Daughter    Colon cancer Neg Hx    Pancreatic cancer Neg Hx    Rectal cancer Neg Hx    Stomach cancer Neg Hx    Esophageal cancer Neg Hx     Social History   Tobacco Use   Smoking status: Never   Smokeless tobacco: Never  Vaping Use   Vaping Use: Never used  Substance Use Topics   Alcohol use: Not Currently   Drug use: Never    Current Outpatient Medications  Medication Sig Dispense Refill   aspirin 81 MG EC tablet Take 1 tablet (81 mg total) by mouth daily. 30 tablet 6   b complex vitamins tablet Take 1 tablet by mouth daily. 30 tablet 11   buPROPion (WELLBUTRIN XL) 150 MG 24 hr tablet TAKE 1 TABLET BY MOUTH EVERY DAY IN THE MORNING 60 tablet 0   buPROPion (WELLBUTRIN XL) 150 MG 24 hr tablet Take by mouth.     carvedilol (COREG) 3.125 MG tablet TAKE 1 TABLET(3.125 MG) BY MOUTH TWICE DAILY WITH A MEAL 180 tablet 3  ESTRADIOL-PROGESTERONE PO Take by mouth.     LORazepam (ATIVAN) 0.5 MG tablet Take 0.5-1 tablets (0.25-0.5 mg total) by mouth every 8 (eight) hours as needed for anxiety. 30 tablet 0   losartan (COZAAR) 25 MG tablet Take 0.5 tablets (12.5 mg total) by mouth daily. 45 tablet 3   MegaRed Omega-3 Krill Oil 500 MG CAPS Take 500 mg by mouth daily. 30 capsule 11   Multiple Vitamins-Minerals (MULTIVITAMIN) tablet Take 1 tablet by mouth daily. 30 tablet 11   nitroGLYCERIN (NITROSTAT) 0.4 MG SL tablet Place 1 tablet (0.4 mg total) under the tongue every 5 (five) minutes as needed for chest pain (up to 3 doses). 25 tablet 6   rosuvastatin (CRESTOR) 5 MG tablet TAKE 1 TABLET(5 MG) BY MOUTH EVERY EVENING 90 tablet 3   Sod Picosulfate-Mag Ox-Cit Acd  (CLENPIQ) 10-3.5-12 MG-GM -GM/160ML SOLN Take 1 kit by mouth as directed. 320 mL 0   venlafaxine (EFFEXOR) 100 MG tablet TAKE 2 TABLETS BY MOUTH EVERY DAY IN THE MORNING AND TAKE 1 TABLET AT BEDTIME 180 tablet 0   cholecalciferol (VITAMIN D3) 25 MCG (1000 UNIT) tablet Take 5,000 Units by mouth daily.     Current Facility-Administered Medications  Medication Dose Route Frequency Provider Last Rate Last Admin   0.9 %  sodium chloride infusion  500 mL Intravenous Continuous Jackquline Denmark, MD        No Known Allergies  Review of Systems:  Constitutional: Denies fever, chills, diaphoresis, appetite change and fatigue.  HEENT: Denies photophobia, eye pain, redness, hearing loss, ear pain, congestion, sore throat, rhinorrhea, sneezing, mouth sores, neck pain, neck stiffness and tinnitus.   Respiratory: Denies SOB, DOE, cough, chest tightness,  and wheezing.   Cardiovascular: Denies chest pain, palpitations and leg swelling.  Genitourinary: Denies dysuria, urgency, frequency, hematuria, flank pain and difficulty urinating.  Musculoskeletal: Denies myalgias, back pain, joint swelling, arthralgias and gait problem.  Skin: No rash.  Neurological: Denies dizziness, seizures, syncope, weakness, light-headedness, numbness and headaches.  Hematological: Denies adenopathy. Easy bruising, personal or family bleeding history  Psychiatric/Behavioral: Has anxiety or depression     Physical Exam:    BP (!) 149/121    Pulse 97    Temp 97.7 F (36.5 C) (Temporal)    Ht 5' 2"  (1.575 m)    Wt 144 lb (65.3 kg)    SpO2 99%    BMI 26.34 kg/m  Wt Readings from Last 3 Encounters:  02/11/21 144 lb (65.3 kg)  12/24/20 144 lb 6 oz (65.5 kg)  03/26/20 147 lb 12.8 oz (67 kg)   Constitutional:  Well-developed, in no acute distress. Psychiatric: Normal mood and affect. Behavior is normal. HEENT: Pupils normal.  Conjunctivae are normal. No scleral icterus. Cardiovascular: Normal rate, regular rhythm. No  edema Pulmonary/chest: Effort normal and breath sounds normal. No wheezing, rales or rhonchi. Abdominal: Soft, nondistended. Nontender. Bowel sounds active throughout. There are no masses palpable. No hepatomegaly. Rectal: Deferred Neurological: Alert and oriented to person place and time. Skin: Skin is warm and dry. No rashes noted.  Data Reviewed: I have personally reviewed following labs and imaging studies  CBC: CBC Latest Ref Rng & Units 01/20/2018 01/19/2018  WBC 4.0 - 10.5 K/uL 5.0 10.1  Hemoglobin 12.0 - 15.0 g/dL 13.4 14.1  Hematocrit 36.0 - 46.0 % 40.2 43.1  Platelets 150 - 400 K/uL 269 359    CMP: CMP Latest Ref Rng & Units 12/07/2018 01/27/2018 01/20/2018  Glucose 65 - 99 mg/dL 84 80 77  BUN 6 - 24 mg/dL 15 17 9   Creatinine 0.57 - 1.00 mg/dL 0.82 0.82 0.87  Sodium 134 - 144 mmol/L 140 138 139  Potassium 3.5 - 5.2 mmol/L 4.9 4.5 4.4  Chloride 96 - 106 mmol/L 104 101 110  CO2 20 - 29 mmol/L 25 20 23   Calcium 8.7 - 10.2 mg/dL 9.1 9.4 8.6(L)  Total Protein 6.0 - 8.5 g/dL 7.0 - -  Total Bilirubin 0.0 - 1.2 mg/dL 0.4 - -  Alkaline Phos 39 - 117 IU/L 53 - -  AST 0 - 40 IU/L 21 - -  ALT 0 - 32 IU/L 24 - -        Carmell Austria, MD 02/11/2021, 3:05 PM  Cc: Jackquline Denmark, MD

## 2021-02-11 NOTE — Progress Notes (Signed)
Report given to PACU, vss 

## 2021-02-11 NOTE — Op Note (Signed)
[facility] Patient Name: Alexis Ibarra Procedure Date: 02/11/2021 2:56 PM MRN: [id number] Endoscopist: Lynann Bologna , MD Age: 55 Referring MD:  Date of Birth: 1965-08-23 Gender: Female Account #: [id number] Procedure:                Colonoscopy Indications:              Colon cancer screening in patient at increased                            risk: Family history of 1st-degree relative with                            colon polyps (Dad at age < 60 yr) Medicines:                Monitored Anesthesia Care Procedure:                Pre-Anesthesia Assessment:                           - Prior to the procedure, a History and Physical                            was performed, and patient medications and                            allergies were reviewed. The patient's tolerance of                            previous anesthesia was also reviewed. The risks                            and benefits of the procedure and the sedation                            options and risks were discussed with the patient.                            All questions were answered, and informed consent                            was obtained. Prior Anticoagulants: The patient has                            taken no previous anticoagulant or antiplatelet                            agents. ASA Grade Assessment: II - A patient with                            mild systemic disease. After reviewing the risks                            and benefits, the patient was deemed in  satisfactory condition to undergo the procedure.                           After obtaining informed consent, the colonoscope                            was passed under direct vision. Throughout the                            procedure, the patient's blood pressure, pulse, and                            oxygen saturations were monitored continuously. The                            Olympus PCF-H190DL (#[id number])  Colonoscope was                            introduced through the anus and advanced to the the                            cecum, identified by appendiceal orifice and                            ileocecal valve. The colonoscopy was performed                            without difficulty. The patient tolerated the                            procedure well. The quality of the bowel                            preparation was adequate to identify polyps 6 mm                            and larger in size. There was adherent solid stool                            and vegetable material in the cecum which could not                            be fully aspirated. The suction channel of the                            scope got clogged few times. There was adherent                            stool in some areas of the colon which could not be                            fully washed. Of note that the small and flat  lesions could have been missed. Aggressive                            suctioning aspiration was performed. Approximately                            85-90% of the colonic mucosa was visualized                            satisfactorily. The ileocecal valve, appendiceal                            orifice, and rectum were photographed. Scope In: 3:15:28 PM Scope Out: 3:29:07 PM Scope Withdrawal Time: 0 hours 9 minutes 46 seconds  Total Procedure Duration: 0 hours 13 minutes 39 seconds  Findings:                 Multiple medium-mouthed diverticula were found in                            the sigmoid colon.                           Non-bleeding internal hemorrhoids were found during                            retroflexion. The hemorrhoids were small and Grade                            I (internal hemorrhoids that do not prolapse).                           The exam was otherwise without abnormality on                            direct and retroflexion  views. Complications:            No immediate complications. Estimated Blood Loss:     Estimated blood loss: none. Impression:               - Mod sigmoid diverticulosis.                           - Non-bleeding internal hemorrhoids.                           - The examination was otherwise normal on direct                            and retroflexion views.                           - No specimens collected. Recommendation:           - Patient has a contact number available for                            emergencies. The signs  and symptoms of potential                            delayed complications were discussed with the                            patient. Return to normal activities tomorrow.                            Written discharge instructions were provided to the                            patient.                           - High fiber diet.                           - Continue present medications.                           - Repeat colonoscopy in 5 years for screening                            purposes with 2-day prep. Certainly earlier, if                            with any new problems or change in family history.                           - The findings and recommendations were discussed                            with the patient's family. Lynann Bologna, MD 02/11/2021 3:35:52 PM This report has been signed electronically.

## 2021-02-13 ENCOUNTER — Telehealth: Payer: Self-pay

## 2021-02-13 NOTE — Telephone Encounter (Signed)
°  Follow up Call-  Call back number 02/11/2021  Post procedure Call Back phone  # [phone]  Permission to leave phone message Yes  Some recent data might be hidden     Patient questions:  Do you have a fever, pain , or abdominal swelling? No. Pain Score  0 *  Have you tolerated food without any problems? Yes.    Have you been able to return to your normal activities? Yes.    Do you have any questions about your discharge instructions: Diet   No. Medications  No. Follow up visit  No.  Do you have questions or concerns about your Care? No.  Actions: * If pain score is 4 or above: No action needed, pain <4.

## 2021-03-04 ENCOUNTER — Ambulatory Visit: Payer: BC Managed Care – PPO | Admitting: Physician Assistant

## 2021-03-04 ENCOUNTER — Encounter: Payer: Self-pay | Admitting: Physician Assistant

## 2021-03-04 ENCOUNTER — Other Ambulatory Visit: Payer: Self-pay

## 2021-03-04 DIAGNOSIS — F418 Other specified anxiety disorders: Secondary | ICD-10-CM | POA: Diagnosis not present

## 2021-03-04 DIAGNOSIS — F3342 Major depressive disorder, recurrent, in full remission: Secondary | ICD-10-CM | POA: Diagnosis not present

## 2021-03-04 MED ORDER — BUPROPION HCL ER (XL) 150 MG PO TB24: ORAL_TABLET | ORAL | 1 refills | Status: DC

## 2021-03-04 NOTE — Progress Notes (Signed)
[facility] Med Check  Patient ID: Alexis Ibarra,  MRN: [id number]  PCP: Cyndi Bender, PA-C  Date of Evaluation: 03/04/2021 Time spent:20 minutes  Chief Complaint:  Chief Complaint   Anxiety; Depression; Follow-up       HISTORY/CURRENT STATUS: For routine med check.  States she's doing well overall. Since adding the Wellbutrin 6 months ago, she's doing much better. Able to enjoy things, energy and motivation are better since being on the Wellbutrin. Enjoys being with her 2 grandsons, her daughter and son-in-law are having some problems still and she is of course concerned about them.  The situation is not affecting her to the point that she cannot do normal life things.  She is not isolating.  She sleeps well most of the time.  Personal hygiene is normal.  ADLs are normal.  She is retired.  No suicidal or homicidal thoughts.  Has the Ativan but does not take it very often.  States she still probably has about half of a bottle that was prescribed October 2021.  She usually takes it when she has blood drawn or something like that.  She did have some anxiety a month or so ago, triggered by the fact that her daughter needs a place to live and patient's grandparents house would be ideal for them.  But her dad, before he died, told her mom he did not want anyone else living there.  So that brought back some memories of how he was not very affectionate and did not hug or say I love you.  She feels like she has worked through all that and they made peace before he died though.  States she is doing better now.  She saw Rinaldo Cloud, LCSW in therapy within the past month.  Has an appointment in a couple of weeks as well.  Denies dizziness, syncope, seizures, numbness, tingling, tremor, tics, unsteady gait, slurred speech, confusion. Denies muscle or joint pain, stiffness, or dystonia.  Individual Medical History/ Review of Systems: Changes? :Yes she had a routine colonoscopy since last visit.   Everything was normal.  Past medications for mental health diagnoses include: Celexa, Wellbutrin, Lexapro, Ativan  Allergies: Patient has no known allergies.  Current Medications:  Current Outpatient Medications:    aspirin 81 MG EC tablet, Take 1 tablet (81 mg total) by mouth daily., Disp: 30 tablet, Rfl: 6   b complex vitamins tablet, Take 1 tablet by mouth daily., Disp: 30 tablet, Rfl: 11   carvedilol (COREG) 3.125 MG tablet, TAKE 1 TABLET(3.125 MG) BY MOUTH TWICE DAILY WITH A MEAL, Disp: 180 tablet, Rfl: 3   ESTRADIOL-PROGESTERONE PO, Take by mouth., Disp: , Rfl:    LORazepam (ATIVAN) 0.5 MG tablet, Take 0.5-1 tablets (0.25-0.5 mg total) by mouth every 8 (eight) hours as needed for anxiety., Disp: 30 tablet, Rfl: 0   losartan (COZAAR) 25 MG tablet, Take 0.5 tablets (12.5 mg total) by mouth daily., Disp: 45 tablet, Rfl: 3   MegaRed Omega-3 Krill Oil 500 MG CAPS, Take 500 mg by mouth daily., Disp: 30 capsule, Rfl: 11   nitroGLYCERIN (NITROSTAT) 0.4 MG SL tablet, Place 1 tablet (0.4 mg total) under the tongue every 5 (five) minutes as needed for chest pain (up to 3 doses)., Disp: 25 tablet, Rfl: 6   rosuvastatin (CRESTOR) 5 MG tablet, TAKE 1 TABLET(5 MG) BY MOUTH EVERY EVENING, Disp: 90 tablet, Rfl: 3   venlafaxine (EFFEXOR) 100 MG tablet, TAKE 2 TABLETS BY MOUTH EVERY DAY IN THE MORNING AND TAKE 1 TABLET  AT BEDTIME, Disp: 180 tablet, Rfl: 0   buPROPion (WELLBUTRIN XL) 150 MG 24 hr tablet, Take by mouth., Disp: , Rfl:    buPROPion (WELLBUTRIN XL) 150 MG 24 hr tablet, TAKE 1 TABLET BY MOUTH EVERY DAY IN THE MORNING, Disp: 90 tablet, Rfl: 1   cholecalciferol (VITAMIN D3) 25 MCG (1000 UNIT) tablet, Take 5,000 Units by mouth daily., Disp: , Rfl:    Multiple Vitamins-Minerals (MULTIVITAMIN) tablet, Take 1 tablet by mouth daily. (Patient not taking: Reported on 03/04/2021), Disp: 30 tablet, Rfl: 11   Sod Picosulfate-Mag Ox-Cit Acd (CLENPIQ) 10-3.5-12 MG-GM -GM/160ML SOLN, Take 1 kit by mouth as  directed., Disp: 320 mL, Rfl: 0 Medication Side Effects: none  Family Medical/ Social History: Changes? No  MENTAL HEALTH EXAM:  There were no vitals taken for this visit.There is no height or weight on file to calculate BMI.  General Appearance: Casual, Neat and Well Groomed  Eye Contact:  Good  Speech:  Clear and Coherent and Normal Rate  Volume:  Normal  Mood:  Euthymic  Affect:  Congruent  Thought Process:  Goal Directed and Descriptions of Associations: Circumstantial  Orientation:  Full (Time, Place, and Person)  Thought Content: Logical   Suicidal Thoughts:  No  Homicidal Thoughts:  No  Memory:  WNL  Judgement:  Good  Insight:  Good  Psychomotor Activity:  Normal  Concentration:  Concentration: Good and Attention Span: Good  Recall:  Good  Fund of Knowledge: Good  Language: Good  Assets:  Desire for Improvement  ADL's:  Intact  Cognition: WNL  Prognosis:  Good     DIAGNOSES:    ICD-10-CM   1. Major depression, recurrent, full remission ([place])  F33.42     2. Situational anxiety  F41.8         Receiving Psychotherapy:  No   RECOMMENDATIONS: PDMP was reviewed.  Last Ativan prescription filled 12/12/2019.  Testosterone powder given 01/08/2021. I provided 20 minutes of face to face time during this encounter, including time spent before and after the visit in records review, medical decision making, counseling pertinent to today's visit, and charting.  I am glad to see her doing so well.  No changes in medications are necessary. Continue Wellbutrin XL 150 mg, 1 p.o. every morning. Continue Ativan 0.5 mg, 1-2 every 8 hours as needed anxiety. Continue Effexor 100 mg, 2 p.o. every morning and 1 p.o. nightly. Continue multivitamin, omega red, vitamin D, and B complex daily. Return in 6 months.  Donnal Moat, PA-C

## 2021-03-17 ENCOUNTER — Other Ambulatory Visit: Payer: Self-pay | Admitting: Physician Assistant

## 2021-03-18 ENCOUNTER — Other Ambulatory Visit: Payer: Self-pay

## 2021-03-18 ENCOUNTER — Ambulatory Visit: Payer: BC Managed Care – PPO | Admitting: Psychiatry

## 2021-03-18 DIAGNOSIS — F32A Depression, unspecified: Secondary | ICD-10-CM

## 2021-03-18 NOTE — Progress Notes (Signed)
[facility] Counselor/Therapist Progress Note  Patient ID: Alexis Ibarra, MRN: [id number],    Date: 03/18/2021  Time Spent: 58 minutes   Treatment Type: Individual Therapy  Reported Symptoms: depression improved, anxiety improved; "my issues have mostly been with my dad"  Mental Status Exam:  Appearance:   Casual     Behavior:  Appropriate, Sharing, and Motivated  Motor:  Normal  Speech/Language:   Clear and Coherent  Affect:  Depressed and anxiety improved  Mood:  anxious and depressed  Thought process:  goal directed  Thought content:    WNL  Sensory/Perceptual disturbances:    WNL  Orientation:  oriented to person, place, time/date, situation, day of week, month of year, year, and stated date of Jan. 23, 2023  Attention:  "More short attention span"  Concentration:  Good and Fair  Memory:  WNL  Fund of knowledge:   Good  Insight:    Good and Fair  Judgment:   Good  Impulse Control:  Good   Risk Assessment: Danger to Self:  No Self-injurious Behavior: No Danger to Others: No Duty to Warn:no Physical Aggression / Violence:No  Access to Firearms a concern: No  Gang Involvement:No   Subjective:  Patient in today reporting improvement in her sadness, anxiety, depression, and unresolved grief related to the death of her father in 07/01/2018 and some issues that have resulted from that loss. "I don't want to let anybody down" but acknowledges the need to take care of herself." Seems to be realizing that self-care issues are more important than she had thought previously. Processed some past concerns that have helped lead to her self-care issues, and also discussed strategies for better self-care. Discussed about her daughter (nurse, had bariatric surgery), worries that daughter spends a lot of time on herself versus the kids. Worries about her grandchildren and her daughter. Difficulty stating her own needs and worked on this more today. More heavily focused on self-care: take breaks  from her electronics, exercise, healthy eating, get out and move.Working on some impulse control issues with unnecessary buying of items. Continues work on unresolved issues from past.Some unresolved grief, assumptions not reality-based. Over worrying and trying to let go of those worries, realizing the worrying does nothing to help the situation, and instead actually wears her down mentally and emotionally.  Interventions: Solution-Oriented/Positive Psychology, Ego-Supportive, and Insight-Oriented  Treatment Goals: Goals remain on tx plan while patient works on strategies to reach her goals.  Progress is documented each session in "Progress" section of Plan. Long term goal: Reduce overall level, frequency, and intensity of anxiety so that daily functioning is not impaired. Short term goal: Increase understanding of beliefs and messages that produce worry and anxiety. Strategy: 1.Explore cognitive messages that generate anxiety and retrain in adaptive cognitions. 2.Develop behavioral and cognitive strategies to reduce or eliminate irrational anxiety.  Diagnosis:   ICD-10-CM   1. Depression, unspecified depression type  F32.A      Plan: Patient in today showing active engagement and good motivation in session as she worked on further issues regarding her anxiety, depression, negative assumptions, trying to let go of worries, multiple family issues and some decision-making as noted above.  Encouraged patient and her practice of more positive behaviors including believing in herself and her ability to make changes in her thinking and behavior, reflecting on progress more often, for every negative thought create 2 positives, staying in the present focusing on what she can control or change, getting outside daily and  walking as she is able, staying in touch with people who are supportive, looking for more positives versus negatives daily, remain on her prescribed medication, healthy nutrition and  exercise, being open to other ways of looking at issues, stop assuming worst case scenarios, practice consistent positive self talk, practice more intentional listening to others to really hear them before responding, reduce overthinking and over analyzing, consider that sometimes she does not fully understand what someone else says or maybe took it the wrong way, challenge and counteract her self doubt, interrupt negative/anxious thoughts to challenge and replace them with more reality based thoughts, saying no without feeling guilty, letting go of things including guilt from the past that hold her back, be willing to work on some "control issues", and feel good about the strength she shows working with goal-directed behaviors to move in a direction that supports improved emotional health  Goal review and progress/challenges noted with patient.  Next appointment within 2 to 3 weeks.  This record has been created using [employer].  Chart creation errors have been sought, but may not always have been located and corrected.  Such creation errors do not reflect on the standard of medical care provided.   Mathis Fare, LCSW

## 2021-03-27 ENCOUNTER — Other Ambulatory Visit: Payer: Self-pay | Admitting: Cardiovascular Disease

## 2021-04-08 ENCOUNTER — Ambulatory Visit: Payer: BC Managed Care – PPO | Admitting: Psychiatry

## 2021-04-29 ENCOUNTER — Other Ambulatory Visit: Payer: Self-pay

## 2021-04-29 ENCOUNTER — Ambulatory Visit: Payer: BC Managed Care – PPO | Admitting: Psychiatry

## 2021-04-29 DIAGNOSIS — F418 Other specified anxiety disorders: Secondary | ICD-10-CM

## 2021-04-29 NOTE — Progress Notes (Signed)
?    [facility] Counselor/Therapist Progress Note ? ?Patient ID: Alexis Ibarra, MRN: [id number],   ? ?Date: 04/29/2021 ? ?Time Spent: 50 minutes  ? ?Treatment Type: Individual Therapy ? ?Reported Symptoms: situational anxiety, stressed (family related); think "a lot of my issues are related to relationship with my dad and he didn't show warmth and he had a strict tone when he spoke."Grieves not not having better relationship with dad. ? ?Mental Status Exam: ? ?Appearance:   Casual     ?Behavior:  Appropriate, Sharing, and Motivated  ?Motor:  Normal  ?Speech/Language:   Clear and Coherent  ?Affect:  anxious  ?Mood:  anxious  ?Thought process:  goal directed  ?Thought content:    overthinking  ?Sensory/Perceptual disturbances:    WNL  ?Orientation:  oriented to person, place, time/date, situation, day of week, month of year, year, and stated date of April 29, 2021  ?Attention:  Good  ?Concentration:  Good  ?Memory:  WNL  ?Fund of knowledge:   Good  ?Insight:    Good and Fair  ?Judgment:   Good  ?Impulse Control:  Good  ? ?Risk Assessment: ?Danger to Self:  No ?Self-injurious Behavior: No ?Danger to Others: No ?Duty to Warn:no ?Physical Aggression / Violence:No  ?Access to Firearms a concern: No  ?Gang Involvement:No  ? ?Subjective:  Patient in today reporting depression and grief over past relationship with dad, some of which she has worked on previously in sessions.Better in stating her own needs today in working on some of "what I missed from dad" including a warmer/positive relationship, and his not being emotionally supportive of patient. Grieves her dad not  being more emotionally senstive, didn't show affection, but did care for her financially. Realizing in some of her current family relationships more recently, how much she missed having encouragement and affection from her dad, and now feels that was related in some ways to her depression, sadness, and unresolved grief over dad's death.  Did well in working  through some of these issues today and having clearer insight.  States that this is a part of unresolved issues from her past as she is being more mindful of some unresolved grief and having made some assumptions that were not accurate that can lead to increased worrying.  Plans to pick up on this at next session and to practice more self acceptance especially around some of the issues discussed today. ? ?Interventions: Solution-Oriented/Positive Psychology and Insight-Oriented ?  ?Treatment Goals: ?Goals remain on tx plan while patient works on strategies to reach her goals.  Progress is documented each session in "Progress" section of Plan. ?Long term goal: ?Reduce overall level, frequency, and intensity of anxiety so that daily functioning is not impaired. ?Short term goal: ?Increase understanding of beliefs and messages that produce worry and anxiety. ?Strategy: ?1.Explore cognitive messages that generate anxiety and retrain in adaptive cognitions. ?2.Develop behavioral and cognitive strategies to reduce or eliminate irrational anxiety. ? ?Diagnosis: ?  ICD-10-CM   ?1. Situational anxiety  F41.8   ?  ? ?Plan:  Patient in today showing good motivation ad participation in session working further on her depression, negative assumptions, worries, and grieving the past relationship with her father that was not very emotionally supportive.  Grieves not having had a better relationship with her dad and feels that has affected her moving forward more and in her struggle with anxiety and some depression earlier on. States this has been bothering her a while and wanted to process it and "  I feel it's a lot of the reason why I am like I am and have some hesitancy is because of my dad, and don't want anyone mad or upset with me." Was able to process this in session today and feel more calm and grounded, and better understand some of her feelings about her dad without judging herself and having a lot of reluctance in talking  with others at times and not wanting to be critical of dad. Encouraged patient in her practice of more positive behaviors including believing in herself and her ability to make changes in her behavior and in her thinking, reflecting on progress more often, for every negative thought create 2 positives, staying in the present focusing on what she can control or change versus cannot, get outside daily and walk as she is able, staying in touch with people who are supportive, looking for more positives versus negatives daily, remain on her prescribed medication, healthy nutrition and exercise, being open to other ways of looking at issues, stop assuming worst case scenarios, practice consistent positive self talk, practice more intentional listening to others to really hear them before responding, reduce overthinking and over analyzing, consider that sometimes she does not fully understand what someone else says or maybe took it the wrong way, challenge and counteract her self doubt, interrupt negative/anxious thoughts to challenge and replace them with more reality-based thoughts, saying no without feeling guilty, letting go of things including guilt from the past that hold her back, be willing to work on some "control issues", and recognize the strengths she shows working with goal-directed behaviors to move in a direction that supports her improved emotional health ?Goal review and progress/challenges noted with patient. ? ?Next appointment within 3 weeks. ? ?This record has been created using [employer].  Chart creation errors have been sought, but may not always have been located and corrected.  Such creation errors do not reflect on the standard of medical care provided. ? ? ?Shanon Ace, LCSW ? ? ? ? ? ? ? ? ? ? ? ? ? ? ? ? ? ? ?

## 2021-06-17 ENCOUNTER — Ambulatory Visit: Payer: BC Managed Care – PPO | Admitting: Psychiatry

## 2021-06-17 DIAGNOSIS — F418 Other specified anxiety disorders: Secondary | ICD-10-CM

## 2021-06-17 NOTE — Progress Notes (Signed)
?    [facility] Counselor/Therapist Progress Note ? ?Patient ID: Alexis Ibarra, MRN: [id number],   ? ?Date: 06/17/2021 ? ?Time Spent: 50 minutes  ? ?Treatment Type: Individual Therapy ? ?Reported Symptoms: anxiety, worrying excessively, depressed "but not as bad, but more anxious."  ? ?Mental Status Exam: ? ?Appearance:   Casual     ?Behavior:  Appropriate, Sharing, and Motivated  ?Motor:  Normal  ?Speech/Language:   Clear and Coherent  ?Affect:  Depressed and anxious  ?Mood:  anxious and depressed  ?Thought process:  goal directed  ?Thought content:    Overthinking and obsessive  ?Sensory/Perceptual disturbances:    WNL  ?Orientation:  oriented to person, place, time/date, situation, day of week, month of year, year, and stated date of June 17, 2021  ?Attention:  Good  ?Concentration:  Good  ?Memory:  None  ?Fund of knowledge:   Good  ?Insight:    Good and Fair  ?Judgment:   Good  ?Impulse Control:  Good and Fair  ? ?Risk Assessment: ?Danger to Self:  No ?Self-injurious Behavior: No ?Danger to Others: No ?Duty to Warn:no ?Physical Aggression / Violence:No  ?Access to Firearms a concern: No  ?Gang Involvement:No  ? ?Subjective: Patient in today reporting situational anxiety mostly related to her daughter and grandchildren. "Daughter recently initiated separation and intends to divorce husband." Patient having anxiety "about the whole situation" and especially her grandsons (ages 17 and 43yrs old). Patient providing lots of attention and care for the 2 boys. "I worry about them all the time." Some depression. Working also on unresolved issues from her own past re: relationships within the family, "we just grew up differently and dad being very rigid/stern" and some unresolved issues re: dad's death. "Daughter didn't have good relationship with her dad either." Main issue today is excessive worrying and assuming the worst case scenarios. Some concerns about grandkids being worried a lot. Struggles with things out of her  control, and trying to have clearer insight. Unresolved issues from past aggravating current situation and working to untangle from past in order to move forward. Increased mindfulness. More calm and grounded by session end.  Seemed to feel more empowered to work harder on changes in her thought patterns as they have been "for years", in order to move forward in a more hopeful direction.  Encouraged to practice more self acceptance, especially within some of her family struggles shared today. ? ?Interventions: Solution-Oriented/Positive Psychology, Ego-Supportive, and Insight-Oriented ?  ?Treatment Goals: ?Goals remain on tx plan while patient works on strategies to reach her goals.  Progress is documented each session in "Progress" section of Plan. ?Long term goal: ?Reduce overall level, frequency, and intensity of anxiety so that daily functioning is not impaired. ?Short term goal: ?Increase understanding of beliefs and messages that produce worry and anxiety. ?Strategy: ?1.Explore cognitive messages that generate anxiety and retrain in adaptive cognitions. ?2.Develop behavioral and cognitive strategies to reduce or eliminate irrational anxiety. ? ? ?Diagnosis: ?  ICD-10-CM   ?1. Situational anxiety  F41.8   ?  ? ?Plan: Patient today showing motivation and active participation in session reporting "more anxiety rather than depression but still some depression at times."  Her anxiety is mostly focused on current family situations involving herself, her daughter and her family including grandchildren.  Did share that she felt like she worked on her previous grief regarding relationship with dad and has felt some better in that regard since the time she was last seen.  Main stressor currently  is the other family stressors alluded to above.  Processed a lot of her anxiety and worrying about these family issues in detail today which seemed to help her sort them out a bit and realize that one of the stressors behind a  lot of her concerns is her excessive worrying "which I have done for years and saw others do for years".  Initially feeling unable to really disengage from the worrying and develop healthier patterns, but after working with this in session today seems to have some hope for decreasing her worrying and realizing that change may not be sudden but the sooner she gets to working on it the better chance that she has to see some positive results.  Shared a "worrying poster with her and gave her a copy as she strongly connected with a message on it." Encouraged patient in her practice of more positive behaviors including: Recognizing and interrupting her excessive worrying and trying to replace those thoughts with more realistic and empowering thoughts as discussed in session, reflecting on any progress daily, believing in herself and her ability to make changes in her behavior and in her thinking, for every negative thought create to positives, stay in the present focusing on what she can control or change versus cannot, get outside daily and walk as she is able, staying in touch with people who are supportive, looking for more positives versus negatives daily, remain on her prescribed medication, healthy nutrition and exercise, being open to other ways of looking at issues, stop assuming worst case scenarios, practice consistent positive self talk, practice more intentional listening to others to really hear them before responding, reduce overthinking and over analyzing, consider that sometimes she does not fully understand what someone else says or maybe took it the wrong way, challenge and counteract her self doubt, interrupt negative/anxious thoughts to challenge and replace them with more reality-based thoughts, saying no without feeling guilty, letting go of things including guilt from the past that hold her back, be willing to work on some control issues, and see the strength that she shows when working with  goal-directed behaviors to move in a direction that supports her improved emotional health and overall wellbeing. ? ?Goal review and progress/challenges noted with patient. ? ?Next appointment within 3 to 4 weeks. ? ?This record has been created using [employer].  Chart creation errors have been sought, but may not always have been located and corrected.  Such creation errors do not reflect on the standard of medical care provided. ? ? ?Mathis Fare, LCSW ? ? ? ? ? ? ? ? ? ? ? ? ? ? ? ? ? ? ?

## 2021-06-24 ENCOUNTER — Ambulatory Visit: Payer: BC Managed Care – PPO | Admitting: Cardiovascular Disease

## 2021-06-24 ENCOUNTER — Encounter: Payer: Self-pay | Admitting: Cardiovascular Disease

## 2021-06-24 VITALS — BP 130/74 | HR 74 | Ht 62.0 in | Wt 141.0 lb

## 2021-06-24 DIAGNOSIS — E785 Hyperlipidemia, unspecified: Secondary | ICD-10-CM | POA: Diagnosis not present

## 2021-06-24 DIAGNOSIS — I5181 Takotsubo syndrome: Secondary | ICD-10-CM

## 2021-06-24 NOTE — Patient Instructions (Signed)
Medication Instructions:  ?Your physician recommends that you continue on your current medications as directed. Please refer to the Current Medication list given to you today. ? ?*If you need a refill on your cardiac medications before your next appointment, please call your pharmacy* ? ? ?Lab Work: ?None ?If you have labs (blood work) drawn today and your tests are completely normal, you will receive your results only by: ?MyChart Message (if you have MyChart) OR ?A paper copy in the mail ?If you have any lab test that is abnormal or we need to change your treatment, we will call you to review the results. ? ? ?Follow-Up: ?At [facility], you and your health needs are our priority.  As part of our continuing mission to provide you with exceptional heart care, we have created designated Provider Care Teams.  These Care Teams include your primary Cardiologist (physician) and Advanced Practice Providers (APPs -  Physician Assistants and Nurse Practitioners) who all work together to provide you with the care you need, when you need it. ? ?Your next appointment:   ?1 year(s) ? ?The format for your next appointment:   ?In Person ? ?Provider:   ?Philip Nahser, MD   ? ?

## 2021-06-24 NOTE — Progress Notes (Signed)
?Cardiology Office Note:   ? ?Date:  06/24/2021  ? ?ID:  Alexis Ibarra, DOB 10-14-1965, MRN [id number] ? ?PCP:  Cyndi Bender, PA-C  ?Cardiologist:  Mertie Moores, MD  ?Electrophysiologist:  None  ? ?Referring MD: Cyndi Bender, PA-C  ? ?No chief complaint on file. ?Takotsubo Syndrome ? ?Previous notes:  ? ?Alexis Ibarra is a 56 y.o. female with a hx of Takotsubo syndrome.  ?Seen with husband, Deidre Ala. ? ? She was admitted to the hospital several weeks ago.  She had minimal troponin levels.  Cardiac cath revealed very minimal coronary artery disease but she had left ventriculogram that was consistent with takotsubo syndrome. ? ?We started Coreg ?Has had some fatigue for a few days but this has resolved.  ?Has not been exercising. ? ?December 07, 2018: ?Alexis Ibarra seen today for follow-up visit.  She was hospitalized with chest pain and positive troponin levels.  She was thought to have Takotsubo  Syndrome. ? ?Has had 1 episode of CP .  Took 1 NTG  ?No further CP  ?Has retired from work ( worked in the [employer] )  ? ?Jan. 31, 2022 ?Alexis Ibarra is seen for follow up visit for her Takotsubo syndrome in 2019 ?Her last echo shows normal LV function ? brought labs from urology visit  ?BMP, LFTs, were stable ?Chol =149 ?Trigs = 91 ?HDL = 63 ?LDL = 69 ? ? ?tsh is normal  ? ?Jun 24, 2021: Alexis Ibarra is seen today for follow-up visit.  She had what is most likely a Takotsubo syndrome several years ago. ?Catheterization November, 2019 reveals a 40% right PDA stenosis.  She had normal left ventricular systolic function. ? ?She brought labs from [facility].  Her CBC is within normal limits.  C-Met is normal.  Potassium is 4.3.  Creatinine 0.91.  Glucose is 79.  TSH is 2.48.  Vitamin B12 level is normal.  Lipid panel ? ? ?Total cholesterol 140 ?Triglyceride level 61 ?HDL 61 ?LDL 66 ? ? ?Past Medical History:  ?Diagnosis Date  ? Acne   ? Anxiety   ? Depression   ? Hematochezia   ? Takotsubo cardiomyopathy   ? a. NSTEMI 12/2017 -> cath with normal  coronaries, preserved EF but apical ballooning suggestive of Takotsubo cardiomyopathy.  ? ? ?Past Surgical History:  ?Procedure Laterality Date  ? COLONOSCOPY  09/21/2014  ? Moderate predominantly sigmoid diverticulosis. Otherwise normal colonoscopy  ? LEFT HEART CATH AND CORONARY ANGIOGRAPHY N/A 01/19/2018  ? Procedure: LEFT HEART CATH AND CORONARY ANGIOGRAPHY;  Surgeon: Leonie Man, MD;  Location: [facility];  Service: Cardiovascular;  Laterality: N/A;  ? ? ?Current Medications: ?Current Meds  ?Medication Sig  ? aspirin 81 MG EC tablet Take 1 tablet (81 mg total) by mouth daily.  ? b complex vitamins tablet Take 1 tablet by mouth daily.  ? buPROPion (WELLBUTRIN XL) 150 MG 24 hr tablet TAKE 1 TABLET BY MOUTH EVERY DAY IN THE MORNING  ? carvedilol (COREG) 3.125 MG tablet TAKE 1 TABLET(3.125 MG) BY MOUTH TWICE DAILY WITH A MEAL  ? ESTRADIOL-PROGESTERONE PO Take by mouth.  ? LORazepam (ATIVAN) 0.5 MG tablet Take 0.5-1 tablets (0.25-0.5 mg total) by mouth every 8 (eight) hours as needed for anxiety.  ? losartan (COZAAR) 25 MG tablet Take 0.5 tablets (12.5 mg total) by mouth daily.  ? MegaRed Omega-3 Krill Oil 500 MG CAPS Take 500 mg by mouth daily.  ? metroNIDAZOLE (METROCREAM) 0.75 % cream APPLY TO AFFECTED AREA TWICE A DAY  ?  Multiple Vitamins-Minerals (MULTIVITAMIN) tablet Take 1 tablet by mouth daily.  ? nitroGLYCERIN (NITROSTAT) 0.4 MG SL tablet Place 1 tablet (0.4 mg total) under the tongue every 5 (five) minutes as needed for chest pain (up to 3 doses).  ? rosuvastatin (CRESTOR) 5 MG tablet TAKE 1 TABLET(5 MG) BY MOUTH EVERY EVENING  ? venlafaxine (EFFEXOR) 100 MG tablet TAKE 2 TABLETS BY MOUTH EVERY DAY IN THE MORNING AND TAKE 1 TABLET AT BEDTIME  ?  ? ?Allergies:   Patient has no known allergies.  ? ?Social History  ? ?Socioeconomic History  ? Marital status: Married  ?  Spouse name: Not on file  ? Number of children: 1  ? Years of education: Not on file  ? Highest education level: Associate  degree: academic program  ?Occupational History  ? Occupation: Herbalist  ?  Employer: [employer]  ?  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 Smoking status: Never  ? Smokeless tobacco: Never  ?Vaping Use  ? Vaping Use: Never used  ?Substance and Sexual Activity  ? Alcohol use: Not Currently  ? Drug use: Never  ? Sexual activity: Not on file  ?Other Topics Concern  ? Not on file  ?Social History Narrative  ? 2nd marriage.  4 years.    ? Has a 57 yo dtr from previous marriage. 2 grandsons.  2 stepdtrs.  ? Grew up with both parents in the home.  They've been married 46 years.  Has a younger brother.   ? Grew up in [place], [place] and still lives there.   ? Went to [employer]  Plans to retire in Oct. And will help out family.  ?   ? Darrick Meigs  ?   ? No legal issues   ?   ? Caffeine 1-2 coffee/day.   ? ?Social Determinants of Health  ? ?Financial Resource Strain: Not on file  ?Food Insecurity: Not on file  ?Transportation Needs: Not on file  ?Physical Activity: Not on file  ?Stress: Not on file  ?Social Connections: Not on file  ?  ? ?Family History: ?The patient's family history includes CAD in her maternal grandfather; Cirrhosis in her father; Colon polyps in her father; Colonic polyp in her father; Dementia in her maternal grandmother and paternal grandmother; Depression in her daughter; Healthy in her brother; Heart failure in her paternal grandfather; Hypertension in her father; Hypothyroidism in her mother; Kidney disease in her father; Liver cancer in her father; Prostate cancer in her paternal grandfather. There is no history of Colon cancer, Pancreatic cancer, Rectal cancer, Stomach cancer, or Esophageal cancer. ? ?ROS:   ?Please see the history of present illness.    ? All other systems reviewed and are negative. ? ?EKGs/Labs/Other Studies Reviewed:   ? ?The following studies were reviewed today: ? ? ? ? ?Recent Labs: ?No results found for requested labs within last 8760 hours.   ?Recent Lipid Panel ?   ?Component Value Date/Time  ? CHOL 144 12/07/2018 0947  ? TRIG 57 12/07/2018 0947  ? HDL 63 12/07/2018 0947  ? CHOLHDL 2.3 12/07/2018 0947  ? CHOLHDL 2.8 01/19/2018 1047  ? VLDL 8 01/19/2018 1047  ? [place] 69 12/07/2018 0947  ? ? ?Physical Exam: ?Blood pressure 130/74, pulse 74, height 5' 2" (1.575 m), weight 141 lb (64 kg), SpO2 99 %. ? ?GEN:  Well nourished, well developed in no acute distress ?HEENT: Normal ?NECK: No JVD; No carotid bruits ?LYMPHATICS: No lymphadenopathy ?CARDIAC:  RRR , soft systolic flow murur  ?RESPIRATORY:  Clear to auscultation without rales, wheezing or rhonchi  ?ABDOMEN: Soft, non-tender, non-distended ?MUSCULOSKELETAL:  No edema; No deformity  ?SKIN: Warm and dry ?NEUROLOGIC:  Alert and oriented x 3 ? ? ?EKG:   Jun 24, 2021: Normal sinus rhythm at 74.  Possible left atrial enlargement. ? ?ASSESSMENT:   ? ?1. Takotsubo cardiomyopathy   ?2. Hyperlipidemia, unspecified hyperlipidemia type   ? ? ?PLAN:   ? ? ?1.  Takotsubo syndrome:    Alexis Ibarra is doing well.  Cont current meds. No recurrent chest pain  ?Echo showed normal LV function  ?We had a long discussion about when she might be able to come off her medications.  She is on low doses of carvedilol, losartan.  At this point I think we should continue with her same medications.  We can discuss again next year. ?We will consider doing an echocardiogram next year. ? ? ?2.  Mild coronary artery disease:    Her LDL is 66.  Continue current medications. ? ?3.  Anxiety:  .  ? ? ?  ? ? ?Medication Adjustments/Labs and Tests Ordered: ?Current medicines are reviewed at length with the patient today.  Concerns regarding medicines are outlined above.  ?Orders Placed This Encounter  ?Procedures  ? EKG 12-Lead  ? ?No orders of the defined types were placed in this encounter. ? ?We will have her see [place] again in 1 year. ? ?Patient Instructions  ?Medication Instructions:  ?Your physician recommends that you continue on your current  medications as directed. Please refer to the Current Medication list given to you today. ? ?*If you need a refill on your cardiac medications before your next appointment, please call your pharmacy* ? ? ?Lab Work:

## 2021-06-25 ENCOUNTER — Other Ambulatory Visit: Payer: Self-pay | Admitting: Cardiovascular Disease

## 2021-07-17 ENCOUNTER — Other Ambulatory Visit: Payer: Self-pay | Admitting: Cardiovascular Disease

## 2021-07-29 ENCOUNTER — Ambulatory Visit: Payer: BC Managed Care – PPO | Admitting: Psychiatry

## 2021-07-29 DIAGNOSIS — F411 Generalized anxiety disorder: Secondary | ICD-10-CM | POA: Diagnosis not present

## 2021-07-29 NOTE — Progress Notes (Signed)
[facility] Counselor/Therapist Progress Note  Patient ID: Alexis Ibarra, MRN: [id number],    Date: 07/29/2021  Time Spent: 55 minutes   Treatment Type: Individual Therapy  Reported Symptoms: anxiety "more generalized" and I stress easily, depression  Mental Status Exam:  Appearance:   Neat     Behavior:  Appropriate, Sharing, and Motivated  Motor:  Normal  Speech/Language:   Clear and Coherent  Affect:  Anxious, depressed  Mood:  anxious and depressed  Thought process:  goal directed  Thought content:    WNL  Sensory/Perceptual disturbances:    WNL  Orientation:  oriented to person, place, time/date, situation, day of week, month of year, year, and stated date of July 29, 2021  Attention:  Good  Concentration:  Good and Fair  Memory:  WNL  Fund of knowledge:   Good  Insight:    Good and Fair  Judgment:   Good  Impulse Control:  Good   Risk Assessment: Danger to Self:  No Self-injurious Behavior: No Danger to Others: No Duty to Warn:no Physical Aggression / Violence:No  Access to Firearms a concern: No  Gang Involvement:No   Subjective: Patient today reports "general anxiety" as main symptom along with some depression which a "3 on 1/10 scale".  Lots going on in family involving herself, daughter, daughters' separated husband, and 2 young grandchildren ages 16 and 22. Tension within family but no evidence of any danger to anyone. Patient very caught up in her excessive worrying and worked well on that in session today. More energy. More goal-focused and worked with several specific examples which she felt was helpful. Working with goal-directed behaviors to see a broader picture in life and not just focus on the negatives and what might go wrong. "I do worry a lot all the time but am trying to work on this," as she doesn't want to heavily influence grandsons in that direction.  Processed strategies more at end of session and she will continue working on this between sessions.   Still having issues with things "out of her control" that bother her "and finding it difficult to accept that they are out of her control.  Continue to work on some unresolved issues from her past that aggravate current situations, and showing some progress.  Did show improvement in her openness and ability to work on issues today in session, while practicing more self acceptance, and realizing how holding onto certain ways of viewing things keeps her from being able to move forward in some areas.  Interventions: Solution-Oriented/Positive Psychology and Insight-Oriented  Treatment Goals: Goals remain on tx plan while patient works on strategies to reach her goals.  Progress is documented each session in "Progress" section of Plan. Long term goal: Reduce overall level, frequency, and intensity of anxiety so that daily functioning is not impaired. Short term goal: Increase understanding of beliefs and messages that produce worry and anxiety. Strategy: 1.Explore cognitive messages that generate anxiety and retrain in adaptive cognitions. 2.Develop behavioral and cognitive strategies to reduce or eliminate irrational anxiety.   Diagnosis:   ICD-10-CM   1. Generalized anxiety disorder  F41.1      Plan: Patient today worked quite well on her "generalized anxiety" along with anxiety specific to her family issues and "just having a long history of anxiety".  Trying to have more clear boundaries within the family and have healthy relationship with both daughter and her separated husband, which has its challenges.  Worked well today on some  boundaries but also some specific issues of her excessive worrying and how this sometimes colors her outlook and limits her ability to see the positives, as we worked with specific examples today in session which seemed to help make this more clear for patient.  To continue working with her goal-directed behaviors especially regarding her anxiety, tendency to assume the  worst, and having healthier relationships within the family. Encouraged patient in her practice of more positive behaviors including: Reflecting on progress often, recognizing and interrupting her excessive worrying and trying to replace those thoughts with more realistic and empowering thoughts as worked on in session, believing in herself and her ability to make changes in her behavior and in her thinking, stay in the present focusing on what she can control or change versus cannot, get outside daily and walk as she is able, stay in touch with people who are supportive, look for more positives versus negatives each day, remain on her prescribed medication, healthy nutrition and exercise, being open to other ways of looking at issues, stop assuming worst case scenarios, practice consistent positive self talk, practice more intentional listening to others to really hear them before responding, reduce overthinking and over analyzing, consider that sometimes she does not fully understand what someone else says or maybe took it the wrong way, challenge and counteract her self doubt, interrupt negative/anxious thoughts to challenge and replace them with more reality-based thoughts, saying no without feeling guilty, letting go of things including guilt from the past that hold her back, be willing to work more on some control issues, and realize the strength she shows when working with goal directed behaviors to move in a direction that supports her improved emotional health.  Goal review and progress/challenges noted with patient.  Next appointment within approximately 3 weeks.  This record has been created using [employer].  Chart creation errors have been sought, but may not always have been located and corrected.  Such creation errors do not reflect on the standard of medical care provided.   Mathis Fare, LCSW

## 2021-08-16 ENCOUNTER — Other Ambulatory Visit: Payer: Self-pay | Admitting: Physician Assistant

## 2021-08-21 ENCOUNTER — Ambulatory Visit: Payer: BC Managed Care – PPO | Admitting: Orthopaedic Surgery

## 2021-09-02 ENCOUNTER — Ambulatory Visit: Payer: BC Managed Care – PPO | Admitting: Physician Assistant

## 2021-09-02 ENCOUNTER — Ambulatory Visit: Payer: BC Managed Care – PPO | Admitting: Psychiatry

## 2021-09-04 ENCOUNTER — Encounter: Payer: Self-pay | Admitting: Orthopaedic Surgery

## 2021-09-04 ENCOUNTER — Ambulatory Visit: Payer: BC Managed Care – PPO | Admitting: Orthopaedic Surgery

## 2021-09-04 DIAGNOSIS — M546 Pain in thoracic spine: Secondary | ICD-10-CM

## 2021-09-04 NOTE — Progress Notes (Signed)
Office Visit Note   Patient: Alexis Ibarra           Date of Birth: 1965/09/28           MRN: [id number] Visit Date: 09/04/2021              Requested by: Lonie Peak, PA-C [id number] [place],  [place] [address] PCP: Lonie Peak, PA-C   Assessment & Plan: Visit Diagnoses:  1. Pain in thoracic spine     Plan: Exam findings were reviewed.  Follow-Up Instructions: No follow-ups on file.   Orders:  No orders of the defined types were placed in this encounter.  No orders of the defined types were placed in this encounter.     Procedures: No procedures performed   Clinical Data: No additional findings.   Subjective: No chief complaint on file.   HPI 56 year old female with history of Takotsubo cardiomyopathy.  She also has generalized anxiety history of depression.  She denies any problems with seizures dizziness unsteady gait or falling.  No numbness or tingling in her arms no severe pain with neck range of motion.  Negative for syncope.  Patient been treated in the past with antianxiety medicine and antidepressant medication.  Patient up-to-date on routine mammography.  Review of Systems all the systems are noncontributory to HPI.   Objective: Vital Signs: BP (!) 151/85   Pulse 87   Ht 5\' 2"  (1.575 m)   Wt 141 lb (64 kg)   BMI 25.79 kg/m   Physical Exam Constitutional:      Appearance: She is well-developed.  HENT:     Head: Normocephalic.     Right Ear: External ear normal.     Left Ear: External ear normal. There is no impacted cerumen.  Eyes:     Pupils: Pupils are equal, round, and reactive to light.  Neck:     Thyroid: No thyromegaly.     Trachea: No tracheal deviation.  Cardiovascular:     Rate and Rhythm: Normal rate.  Pulmonary:     Effort: Pulmonary effort is normal.  Abdominal:     Palpations: Abdomen is soft.  Musculoskeletal:     Cervical back: No rigidity.  Skin:    General: Skin is warm and dry.  Neurological:      Mental Status: She is alert and oriented to person, place, and time.  Psychiatric:        Behavior: Behavior normal.     Ortho Exam patient has full cervical range of motion normal reflexes.  No lower extremity hyperreflexia no clonus normal heel-toe gait.  Serratus anterior is intact.  Pulses are normal.  Specialty Comments:  No specialty comments available.  Imaging: No results found.   PMFS History: Patient Active Problem List   Diagnosis Date Noted   Pain in thoracic spine 09/16/2021   Pain in right hip 08/02/2019   Takotsubo cardiomyopathy 01/20/2018   Anxiety 01/20/2018   Depression 01/20/2018   Acne 01/20/2018   Elevated troponin 01/19/2018   Past Medical History:  Diagnosis Date   Acne    Anxiety    Depression    Hematochezia    Takotsubo cardiomyopathy    a. NSTEMI 12/2017 -> cath with normal coronaries, preserved EF but apical ballooning suggestive of Takotsubo cardiomyopathy.    Family History  Problem Relation Age of Onset   Hypothyroidism Mother    Colonic polyp Father    Hypertension Father    Kidney disease Father    Cirrhosis  Father        non alcoholic   Colon polyps Father        precancerous   Liver cancer Father    Healthy Brother    Dementia Maternal Grandmother    CAD Maternal Grandfather    Dementia Paternal Grandmother    Heart failure Paternal Grandfather    Prostate cancer Paternal Grandfather    Depression Daughter    Colon cancer Neg Hx    Pancreatic cancer Neg Hx    Rectal cancer Neg Hx    Stomach cancer Neg Hx    Esophageal cancer Neg Hx     Past Surgical History:  Procedure Laterality Date   COLONOSCOPY  09/21/2014   Moderate predominantly sigmoid diverticulosis. Otherwise normal colonoscopy   LEFT HEART CATH AND CORONARY ANGIOGRAPHY N/A 01/19/2018   Procedure: LEFT HEART CATH AND CORONARY ANGIOGRAPHY;  Surgeon: Marykay Lex, MD;  [facility]: 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

## 2021-09-16 DIAGNOSIS — M546 Pain in thoracic spine: Secondary | ICD-10-CM | POA: Insufficient documentation

## 2021-09-30 ENCOUNTER — Ambulatory Visit: Payer: BC Managed Care – PPO | Admitting: Psychiatry

## 2021-09-30 DIAGNOSIS — F411 Generalized anxiety disorder: Secondary | ICD-10-CM

## 2021-09-30 NOTE — Progress Notes (Signed)
[facility] Counselor/Therapist Progress Note  Patient ID: Alexis Ibarra, MRN: [id number],    Date: 09/30/2021  Time Spent: 57 minutes   Treatment Type: Individual Therapy  Reported Symptoms: anxiety, tired, some sadness  Mental Status Exam:  Appearance:   Neat     Behavior:  Appropriate, Sharing, and Motivated  Motor:  Normal  Speech/Language:   Clear and Coherent  Affect:  Anxious, some tearfulness  Mood:  anxious and sad  Thought process:  goal directed  Thought content:    WNL  Sensory/Perceptual disturbances:    WNL  Orientation:  oriented to person, place, time/date, situation, day of week, month of year, year, and stated date of September 30, 2021.  Attention:  Good  Concentration:  Good  Memory:  WNL  Fund of knowledge:   Good  Insight:    Good and Fair  Judgment:   Good  Impulse Control:  Good   Risk Assessment: Danger to Self:  No Self-injurious Behavior: No Danger to Others: No Duty to Warn:no Physical Aggression / Violence:No  Access to Firearms a concern: No  Gang Involvement:No   Subjective:  Patient in today reporting anxiety, tiredness, and sadness off and on, and symptoms are mostly related to her relationship with daughter and grandkids and "all that involves". States her marriage of 8 yrs is actually doing well. "Retired and healthy so should be thankful for that and shouldn't focus on my problems."  I'm insecure about myself in some ways and goes on to process herself as mother, grandmother, and wife and her tendency to lack belief in herself, which we worked on today. History of unhealthy feelings about herself and unhealthy relationships, which is a factor she reports, although in a healthier relationship now. No hobbies or outside interests which she acknowledges might be good for her. Discussed some interests she might develop and also other ways of practicing more self-caring behaviors and positive self-talk, which she was also able to work on today.  Rates herself as a "5" on depression scale today, but denies any SI. Aware of her anxious impact on grandkids and others. Difficulty taking time away due to feeling she might be needed by someone in family. Does have weekend trip coming up this weekend.with husband and 2 grandsons which she feels positive about. To continue trying to see more positive versus negatives, and realizing her need for better self-care and self-talk.  Interventions: Cognitive Behavioral Therapy and Ego-Supportive  Treatment Goals: Goals remain on tx plan while patient works on strategies to reach her goals.  Progress is documented each session in "Progress" section of Plan. Long term goal: Reduce overall level, frequency, and intensity of anxiety so that daily functioning is not impaired. Short term goal: Increase understanding of beliefs and messages that produce worry and anxiety. Strategy: 1.Explore cognitive messages that generate anxiety and retrain in adaptive cognitions. 2.Develop behavioral and cognitive strategies to reduce or eliminate irrational anxiety.   Diagnosis:   ICD-10-CM   1. Generalized anxiety disorder  F41.1      Plan: Patient today worked well on her anxiety, tendency towards negative self-talk and self-blaming, all related to her current feelings of anxiety, sadness, and feeling tired. To practice more self-care including taking breaks and allowing for good sleep, being in touch with others that are supportive and planning for more activities she enjoys. To remain on meds as prescribed.  Continued boundary setting and trying to reduce her excessive worrying which is some better at this  point.  Review of goal-directed behaviors especially regarding her tendency to assume worst case scenarios, and her having healthier relationships within the family, but also feeling she can take time off without having to worry "somebody is going to need me". Encouraged patient in her working on more positive  behaviors including: Recognizing and interrupting excessive worrying and replace those thoughts with more realistic/empowering thoughts as worked on in sessions, reflect on her progress often, believing in herself and her ability to make changes in her behavior and in her thinking, stay in the present focusing on what she can control, getting outside daily and walking as she is able, staying in touch with people who are supportive, look for more positives versus negatives each day, remain on her prescribed medication, healthy nutrition and exercise, being open to other ways of looking at issues, stop assuming worst case scenarios, practice consistent positive self talk, practice more intentional listening to others to really hear them before responding, reduce overthinking and over analyzing, consider that sometimes she does not fully understand what someone else says or maybe took it the wrong way, challenge and counteract her self doubt, interrupt negative/anxious thoughts to challenge and replace them with more reality-based thoughts, saying no without feeling guilty, letting go of things including guilt from the past that hold her back, be willing to work more on some control issues, and recognize the strength she shows when working with goal directed behaviors to move in a direction that supports her improved emotional health and overall outlook.  Review and progress/challenges noted with patient.  Next appointment within 3 to 4 weeks.  This record has been created using [employer].  Chart creation errors have been sought, but may not always have been located and corrected.  Such creation errors do not reflect on the standard of medical care provided.   Mathis Fare, LCSW

## 2021-10-21 ENCOUNTER — Ambulatory Visit: Payer: BC Managed Care – PPO | Admitting: Psychiatry

## 2021-10-21 ENCOUNTER — Ambulatory Visit: Payer: BC Managed Care – PPO | Admitting: Physician Assistant

## 2021-10-21 ENCOUNTER — Encounter: Payer: Self-pay | Admitting: Physician Assistant

## 2021-10-21 DIAGNOSIS — F411 Generalized anxiety disorder: Secondary | ICD-10-CM | POA: Diagnosis not present

## 2021-10-21 DIAGNOSIS — R5383 Other fatigue: Secondary | ICD-10-CM | POA: Diagnosis not present

## 2021-10-21 DIAGNOSIS — F331 Major depressive disorder, recurrent, moderate: Secondary | ICD-10-CM

## 2021-10-21 MED ORDER — BUPROPION HCL ER (XL) 300 MG PO TB24: 300.0000 mg | ORAL_TABLET | Freq: Every day | ORAL | 1 refills | Status: DC

## 2021-10-21 NOTE — Progress Notes (Signed)
[facility] Counselor/Therapist Progress Note  Patient ID: Alexis Ibarra, MRN: [id number],    Date: 10/21/2021  Time Spent: 57 minutes   Treatment Type: Individual Therapy  Reported Symptoms: anxiety "but noticing some improvement", difficulty letting go of past, overthinking  Mental Status Exam:  Appearance:   Casual     Behavior:  Appropriate, Sharing, and Motivated  Motor:  Normal  Speech/Language:   Clear and Coherent  Affect:  anxious  Mood:  anxious  Thought process:  goal directed  Thought content:    overthinking  Sensory/Perceptual disturbances:    WNL  Orientation:  oriented to person, place, time/date, situation, day of week, month of year, year, and stated date of October 21, 2021  Attention:  Good  Concentration:  Good and Fair  Memory:  WNL  Fund of knowledge:   Good  Insight:    Good  Judgment:   Good  Impulse Control:  Good and Fair   Risk Assessment: Danger to Self:  No Self-injurious Behavior: No Danger to Others: No Duty to Warn:no Physical Aggression / Violence:No  Access to Firearms a concern: No  Gang Involvement:No   Subjective: Patient in today reporting anxiety, overthinking, and "trouble letting go of the past".  Focused more today on her need to let go of the past, specifically stepdaughter's question 9 years ago in reference to dad's money. Processed this event and surrounding situations that have contributed to her "holding onto those memories." Also explored with her how this is contributing to it holding her back from feeling more positive and self-assured now which is what she says is important to her. Working on "having more initiative and being more outgoing" and improving some very gradually." Reports some decrease in sadness. Marriage continues to be "healthy." "Always been self-conscious like my mother and grandmother, but am working on this".  But "I don't want others to think I'm shy and dumb." Still looking for a hobby or outside  interests as she felt that would be good "but haven't found something yet that I'd like." Rates self as a "2" on depression scale 1-10. Rates self as a "5/6" on anxiety scale. Rates self as a "5/6" on worry scale. Does seem a little more positive today and recognizes this herself.  Interventions: Cognitive Behavioral Therapy and Narrative   Treatment Goals: Goals remain on tx plan while patient works on strategies to reach her goals.  Progress is documented each session in "Progress" section of Plan. Long term goal: Reduce overall level, frequency, and intensity of anxiety so that daily functioning is not impaired. Short term goal: Increase understanding of beliefs and messages that produce worry and anxiety. Strategy: 1.Explore cognitive messages that generate anxiety and retrain in adaptive cognitions. 2.Develop behavioral and cognitive strategies to reduce or eliminate irrational anxiety.  Diagnosis:   ICD-10-CM   1. Generalized anxiety disorder  F41.1      Plan: Patient today working on her overthinking, difficulty letting go of the past, and her anxiety all of which are interrelated.  Focused more today on her difficulty letting go of the past as it is causing some issues within her marital/family life.  Was still more motivated and worked well in session, not avoidant, but did have a lot of questions about "why am I like this" which we were able to process together.  Some slight decrease in negative self-talk and self blaming and seems to be motivated to continue working on this.  Less overall sadness and  less feeling tired.  He is making progress in the above-mentioned areas reflecting on her past, overthinking, and anxiety and continues to need treatment to focus on these and other goal directed areas in order to function at her best and be less held back by her psychiatric symptoms. Encouraged patient in practicing more positive behaviors including: Continue to work on letting go of the  past especially things that have been said within the family context earlier that she has held on for years and created resentment for her in the present (which we worked on in session today), interrupting excessive worrying and replace with more realistic/empowering thoughts, reflect more on her progress made, believing more in herself and her ability to make changes in her thinking and in her behavior, stay in the present focusing what she can control, getting outside daily and walking as she is able, staying in touch with people who are supportive, look for more positives versus negatives daily, remain on her prescribed medication, being open to other ways of looking at issues, stop assuming worst case scenarios, healthy nutrition and exercise, practice positive self talk, practice more intentional listening to others to really hear them before responding, reduce overthinking and over analyzing, consider that sometimes she does not fully understand what someone else is said or maybe took it the wrong way rather than making assumptions, challenge and counteract her self doubt, interrupt negative/anxious thoughts to challenge and replace them with more reality based thoughts, saying no without feeling guilty, letting go of things including guilt from the past that hold her back, be willing to work more on some control issues, and realize the strength she shows when working with goal directed behaviors to move in a direction that supports her improved emotional health.  Goal review and progress/challenges noted with patient.  Next appointment within 2 to 3 weeks.  This record has been created using [employer].  Chart creation errors have been sought, but may not always have been located and corrected.  Such creation errors do not reflect on the standard of medical care provided.   Mathis Fare, LCSW

## 2021-10-21 NOTE — Progress Notes (Signed)
[facility] Med Check  Patient ID: Alexis Ibarra,  MRN: [id number]  PCP: Lonie Peak, PA-C  Date of Evaluation: 10/21/2021 Time spent:20 minutes  Chief Complaint:  Chief Complaint   Depression; Anxiety; Follow-up      HISTORY/CURRENT STATUS: For routine med check.  She has felt a little more sad over the past 3 weeks or so.  No real trigger.  Just does not have the energy and motivation like she used to.  Wonders if we can increase the Wellbutrin.  She definitely noticed a difference when she started Wellbutrin and thinks it would be a good idea to bump it up again if appropriate.  Appetite is normal and weight is stable.  ADLs and personal hygiene are normal.  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  She helps take care of her grandchildren and 80-year-old nephew.  So she stays busy.  No suicidal or homicidal thoughts.  She rarely takes Ativan but it is effective when needed.  She still has plenty on hand but wants to make sure I will send in a prescription if needed.  Typically only uses it before she has labs drawn.  She has fainted many times in the past before she was prescribed Ativan.  States she has not passed out in the past few lab draws.  She sleeps well most of the time.  Patient denies increased energy with decreased need for sleep, increased talkativeness, racing thoughts, impulsivity or risky behaviors, increased spending, increased libido, grandiosity, increased irritability or anger, paranoia, and hallucinations.  Denies dizziness, syncope, seizures, numbness, tingling, tremor, tics, unsteady gait, slurred speech, confusion. Denies muscle or joint pain, stiffness, or dystonia.  Individual Medical History/ Review of Systems: Changes? :No     Past medications for mental health diagnoses include: Celexa, Wellbutrin, Lexapro, Ativan  Allergies: Patient has no known allergies.  Current Medications:  Current Outpatient Medications:    aspirin 81 MG EC  tablet, Take 1 tablet (81 mg total) by mouth daily., Disp: 30 tablet, Rfl: 6   b complex vitamins tablet, Take 1 tablet by mouth daily., Disp: 30 tablet, Rfl: 11   buPROPion (WELLBUTRIN XL) 300 MG 24 hr tablet, Take 1 tablet (300 mg total) by mouth daily., Disp: 30 tablet, Rfl: 1   carvedilol (COREG) 3.125 MG tablet, TAKE 1 TABLET(3.125 MG) BY MOUTH TWICE DAILY WITH A MEAL., Disp: 180 tablet, Rfl: 3   ESTRADIOL-PROGESTERONE PO, Take by mouth., Disp: , Rfl:    LORazepam (ATIVAN) 0.5 MG tablet, Take 0.5-1 tablets (0.25-0.5 mg total) by mouth every 8 (eight) hours as needed for anxiety., Disp: 30 tablet, Rfl: 0   losartan (COZAAR) 25 MG tablet, TAKE 1/2 TABLET BY MOUTH EVERY DAY, Disp: 45 tablet, Rfl: 3   MegaRed Omega-3 Krill Oil 500 MG CAPS, Take 500 mg by mouth daily., Disp: 30 capsule, Rfl: 11   metroNIDAZOLE (METROCREAM) 0.75 % cream, APPLY TO AFFECTED AREA TWICE A DAY, Disp: , Rfl:    nitroGLYCERIN (NITROSTAT) 0.4 MG SL tablet, Place 1 tablet (0.4 mg total) under the tongue every 5 (five) minutes as needed for chest pain (up to 3 doses)., Disp: 25 tablet, Rfl: 6   rosuvastatin (CRESTOR) 5 MG tablet, TAKE 1 TABLET BY MOUTH EVERY EVENING (NEED APPOINTMENT FOR ANYMORE REFILLS FOR ALL MEDICATIONS), Disp: 90 tablet, Rfl: 3   venlafaxine (EFFEXOR) 100 MG tablet, TAKE 2 TABLETS BY MOUTH EVERY DAY IN THE MORNING AND TAKE 1 TABLET AT BEDTIME, Disp: 270 tablet, Rfl: 0  Multiple Vitamins-Minerals (MULTIVITAMIN) tablet, Take 1 tablet by mouth daily. (Patient not taking: Reported on 10/21/2021), Disp: 30 tablet, Rfl: 11 Medication Side Effects: none  Family Medical/ Social History: Changes? No  MENTAL HEALTH EXAM:  There were no vitals taken for this visit.There is no height or weight on file to calculate BMI.  General Appearance: Casual, Neat and Well Groomed  Eye Contact:  Good  Speech:  Clear and Coherent and Normal Rate  Volume:  Normal  Mood:  Euthymic  Affect:  Congruent  Thought Process:  Goal  Directed and Descriptions of Associations: Circumstantial  Orientation:  Full (Time, Place, and Person)  Thought Content: Logical   Suicidal Thoughts:  No  Homicidal Thoughts:  No  Memory:  WNL  Judgement:  Good  Insight:  Good  Psychomotor Activity:  Normal  Concentration:  Concentration: Good and Attention Span: Good  Recall:  Good  Fund of Knowledge: Good  Language: Good  Assets:  Desire for Improvement Financial Resources/Insurance Housing Transportation  ADL's:  Intact  Cognition: WNL  Prognosis:  Good   DIAGNOSES:    ICD-10-CM   1. Major depressive disorder, recurrent episode, moderate (HCC)  F33.1     2. Generalized anxiety disorder  F41.1     3. Fatigue, unspecified type  R53.83       Receiving Psychotherapy:  Yes with Rockne Menghini, LCSW  RECOMMENDATIONS: PDMP was reviewed.  Last Ativan prescription filled 12/12/2019.  Testosterone powder given 09/13/2021. I provided 20 minutes of face to face time during this encounter, including time spent before and after the visit in records review, medical decision making, counseling pertinent to today's visit, and charting.   We discussed options.  I agree with increasing Wellbutrin to help with motivation and energy. She responded very well when first started it.  Increase Wellbutrin XL 300 mg,1 qd. Continue Ativan 0.5 mg, 1-2 every 8 hours as needed anxiety.  Okay to refill when needed. Continue Effexor 100 mg, 2 p.o. every morning and 1 p.o. nightly. Continue multivitamin, omega red, vitamin D, and B complex daily. Continue therapy with Rockne Menghini, LCSW.  Return in 4-6 wks.  Melony Overly, PA-C

## 2021-11-11 ENCOUNTER — Ambulatory Visit: Payer: BC Managed Care – PPO | Admitting: Psychiatry

## 2021-11-11 DIAGNOSIS — F411 Generalized anxiety disorder: Secondary | ICD-10-CM | POA: Diagnosis not present

## 2021-11-11 NOTE — Progress Notes (Signed)
[facility] Counselor/Therapist Progress Note  Patient ID: Alexis Ibarra, MRN: [id number],    Date: 11/11/2021  Time Spent: 55 minutes   Treatment Type: Individual Therapy  Reported Symptoms: anxiety, some sleep difficulties and checking on getting a sleep study done  Mental Status Exam:  Appearance:   Casual and Neat     Behavior:  Appropriate and Motivated  Motor:  Normal  Speech/Language:   Clear and Coherent  Affect:  anxious  Mood:  anxious  Thought process:  goal directed  Thought content:    WNL  Sensory/Perceptual disturbances:    WNL  Orientation:  oriented to person, place, time/date, situation, day of week, month of year, year, and stated date of Sept. 18, 2023  Attention:  Good  Concentration:  Good  Memory:  WNL  Fund of knowledge:   Good  Insight:    Good and Fair  Judgment:   Good  Impulse Control:  Good   Risk Assessment: Danger to Self:  No Self-injurious Behavior: No Danger to Others: No Duty to Warn:no Physical Aggression / Violence:No  Access to Firearms a concern: No  Gang Involvement:No   Subjective:  Patient in session today reporting anxiety and difficulty with some family situations that are challenging, and "troubling to patient in some ways."  Overthinking not as significant today. Still working with family issues that are related some to the past but also the present and moving forward. Specifically today needed to focus on some issues with brother and extended family including 2 young boys which patient processed concerns "now and going forward".  (Not all details included I this note due to patient privacy needs.) Extremely open today which has been very helpful to patient in more directly confronting some of her issues "over the years." Discussed ways she can continue to interact more with people and feel more comfortable with them, and still have quality family time, feeling more comfortable about some of the confidential/sensitive concerns  she brought up today.  Much less sadness today and some decrease in self-consciousness that has held her back in some ways.  Less depressed.  Some decreased anxiety.  Self-esteem and self-confidence seems to be improving.  Interventions: Cognitive Behavioral Therapy and Ego-Supportive  Treatment Goals: Goals remain on tx plan while patient works on strategies to reach her goals.  Progress is documented each session in "Progress" section of Plan. Long term goal: Reduce overall level, frequency, and intensity of anxiety so that daily functioning is not impaired. Short term goal: Increase understanding of beliefs and messages that produce worry and anxiety. Strategy: 1.Explore cognitive messages that generate anxiety and retrain in adaptive cognitions. 2.Develop behavioral and cognitive strategies to reduce or eliminate irrational anxiety.   Diagnosis:   ICD-10-CM   1. Generalized anxiety disorder  F41.1      Plan:  Patient today showing good motivation and active participation in session as she worked on issues regarding her own self confidence and self-esteem as well as some very sensitive issues relating to family.  (Not all details included in this note due to patient privacy needs.)  Overthinking has decreased some.  Continues to work hard on not living as much in the past and trying to live more currently.  Showing more strength in session and less avoidance of significant issues.  Considering having a few more personal relationships outside of family, and still being able to have a lot of family contact.  Addressed her issue of not feeling like she  is going forward and today she is able to reframe some of the progress she is making and see that it is a forward movement.  Not as self negating today.  Much less sadness overall.  Needs continued therapy to work with goal directed behaviors and making further changes that can help to eventually reflect a decrease in psychiatric symptoms.  Encouraged patient and the practice of more positive behaviors as noted in sessions including: Continue working on letting go of things in the past and especially things that have been said within the family context earlier that she has held on for years and created resentment in the present, interrupting excessive worrying and replace with more realistic/empowering thoughts, reflect more on her progress made, believing more in herself and her ability to make changes in her thinking and in her behavior that will be more positive for her going forward, remaining in the present focusing what she can control, getting outside daily and walking as she is able, staying in touch with people who are supportive, look for more positives versus negatives daily, stay on her prescribed medication, being open to looking at issues and other ways that might be more helpful for her, stop assuming worst case scenarios, healthy nutrition and exercise, positive self talk, listen more intentionally to others to really hear them before responding, reduce overthinking and over analyzing, consider that sometimes she does not fully understand what someone else has said or maybe took it the wrong way rather than making assumptions, challenge and counteract her self doubt, interrupt negative/anxious thoughts to challenge them and replace with more reality based thoughts, saying no without feeling guilty, letting go of things including guilt from the past that hold her back, work on control issues, and recognize the strength she shows when working with goal directed behaviors to move in a direction that supports her improved emotional health and overall outlook.  Goal review and progress/challenges noted with patient.  Next appointment within 3 weeks.  This record has been created using [employer].  Chart creation errors have been sought, but may not always have been located and corrected.  Such creation errors do not reflect on the  standard of medical care provided.   Shanon Ace, LCSW

## 2021-11-18 ENCOUNTER — Other Ambulatory Visit: Payer: Self-pay | Admitting: Physician Assistant

## 2021-12-02 ENCOUNTER — Ambulatory Visit: Payer: BC Managed Care – PPO | Admitting: Psychiatry

## 2021-12-02 ENCOUNTER — Ambulatory Visit: Payer: BC Managed Care – PPO | Admitting: Physician Assistant

## 2021-12-04 ENCOUNTER — Other Ambulatory Visit: Payer: Self-pay | Admitting: Physician Assistant

## 2021-12-08 ENCOUNTER — Other Ambulatory Visit: Payer: Self-pay | Admitting: Physician Assistant

## 2021-12-09 ENCOUNTER — Encounter: Payer: Self-pay | Admitting: Physician Assistant

## 2021-12-09 ENCOUNTER — Ambulatory Visit: Payer: BC Managed Care – PPO | Admitting: Physician Assistant

## 2021-12-09 ENCOUNTER — Ambulatory Visit: Payer: BC Managed Care – PPO | Admitting: Psychiatry

## 2021-12-09 DIAGNOSIS — F3341 Major depressive disorder, recurrent, in partial remission: Secondary | ICD-10-CM | POA: Diagnosis not present

## 2021-12-09 DIAGNOSIS — F411 Generalized anxiety disorder: Secondary | ICD-10-CM

## 2021-12-09 DIAGNOSIS — G4733 Obstructive sleep apnea (adult) (pediatric): Secondary | ICD-10-CM | POA: Diagnosis not present

## 2021-12-09 MED ORDER — BUPROPION HCL ER (XL) 300 MG PO TB24: 300.0000 mg | ORAL_TABLET | Freq: Every day | ORAL | 1 refills | Status: DC

## 2021-12-09 NOTE — Progress Notes (Signed)
[facility] Med Check  Patient ID: Alexis Ibarra,  MRN: [id number]  PCP: Lonie Peak, PA-C  Date of Evaluation: 12/09/2021 Time spent:20 minutes  Chief Complaint:  Chief Complaint   Anxiety; Depression    HISTORY/CURRENT STATUS: For routine med check.  We increased Wellbutrin about 6 weeks ago.  States she is doing well.  Has a little more energy.  Feels good as far as her mood goes.  She had a sleep study done which showed sleep apnea, will be getting a CPAP within the next few days.  As far as she knows she does not snore but does toss and turn a lot during the night.  Patient is able to enjoy things.  She is retired.  No extreme sadness, tearfulness, or feelings of hopelessness. ADLs and personal hygiene are normal.   Denies any changes in concentration, making decisions, or remembering things.  Appetite has not changed.  Weight is stable.  Not isolating.  Denies suicidal or homicidal thoughts.  Patient denies increased energy with decreased need for sleep, increased talkativeness, racing thoughts, impulsivity or risky behaviors, increased spending, increased libido, grandiosity, increased irritability or anger, paranoia, or hallucinations.  Denies dizziness, syncope, seizures, numbness, tingling, tremor, tics, unsteady gait, slurred speech, confusion. Denies muscle or joint pain, stiffness, or dystonia.  Individual Medical History/ Review of Systems: Changes? :No     Past medications for mental health diagnoses include: Celexa, Wellbutrin, Lexapro, Ativan  Allergies: Patient has no known allergies.  Current Medications:  Current Outpatient Medications:    aspirin 81 MG EC tablet, Take 1 tablet (81 mg total) by mouth daily., Disp: 30 tablet, Rfl: 6   b complex vitamins tablet, Take 1 tablet by mouth daily., Disp: 30 tablet, Rfl: 11   carvedilol (COREG) 3.125 MG tablet, TAKE 1 TABLET(3.125 MG) BY MOUTH TWICE DAILY WITH A MEAL., Disp: 180 tablet, Rfl: 3    ESTRADIOL-PROGESTERONE PO, Take by mouth., Disp: , Rfl:    LORazepam (ATIVAN) 0.5 MG tablet, Take 0.5-1 tablets (0.25-0.5 mg total) by mouth every 8 (eight) hours as needed for anxiety., Disp: 30 tablet, Rfl: 0   losartan (COZAAR) 25 MG tablet, TAKE 1/2 TABLET BY MOUTH EVERY DAY, Disp: 45 tablet, Rfl: 3   MegaRed Omega-3 Krill Oil 500 MG CAPS, Take 500 mg by mouth daily., Disp: 30 capsule, Rfl: 11   metroNIDAZOLE (METROCREAM) 0.75 % cream, APPLY TO AFFECTED AREA TWICE A DAY, Disp: , Rfl:    rosuvastatin (CRESTOR) 5 MG tablet, TAKE 1 TABLET BY MOUTH EVERY EVENING (NEED APPOINTMENT FOR ANYMORE REFILLS FOR ALL MEDICATIONS), Disp: 90 tablet, Rfl: 3   venlafaxine (EFFEXOR) 100 MG tablet, TAKE 2 TABLETS BY MOUTH EVERY DAY IN THE MORNING AND TAKE 1 TABLET AT BEDTIME, Disp: 270 tablet, Rfl: 0   buPROPion (WELLBUTRIN XL) 300 MG 24 hr tablet, Take 1 tablet (300 mg total) by mouth daily., Disp: 90 tablet, Rfl: 1   Multiple Vitamins-Minerals (MULTIVITAMIN) tablet, Take 1 tablet by mouth daily. (Patient not taking: Reported on 10/21/2021), Disp: 30 tablet, Rfl: 11   nitroGLYCERIN (NITROSTAT) 0.4 MG SL tablet, Place 1 tablet (0.4 mg total) under the tongue every 5 (five) minutes as needed for chest pain (up to 3 doses). (Patient not taking: Reported on 12/09/2021), Disp: 25 tablet, Rfl: 6 Medication Side Effects: none  Family Medical/ Social History: Changes? No  MENTAL HEALTH EXAM:  There were no vitals taken for this visit.There is no height or weight on file to calculate BMI.  General Appearance: Casual, Neat  and Well Groomed  Eye Contact:  Good  Speech:  Clear and Coherent and Normal Rate  Volume:  Normal  Mood:  Euthymic  Affect:  Congruent  Thought Process:  Goal Directed and Descriptions of Associations: Circumstantial  Orientation:  Full (Time, Place, and Person)  Thought Content: Logical   Suicidal Thoughts:  No  Homicidal Thoughts:  No  Memory:  WNL  Judgement:  Good  Insight:  Good   Psychomotor Activity:  Normal  Concentration:  Concentration: Good and Attention Span: Good  Recall:  Good  Fund of Knowledge: Good  Language: Good  Assets:  Desire for Improvement  ADL's:  Intact  Cognition: WNL  Prognosis:  Good   DIAGNOSES:    ICD-10-CM   1. 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 Obstructive sleep apnea  G47.33     3. Generalized anxiety disorder  F41.1      Receiving Psychotherapy:  Yes with Rinaldo Cloud, LCSW  RECOMMENDATIONS: PDMP was reviewed.  Last Ativan prescription filled 12/12/2019.  Testosterone powder given 09/13/2021. I provided 20 minutes of face to face time during this encounter, including time spent before and after the visit in records review, medical decision making, counseling pertinent to today's visit, and charting.   I am glad to see her doing better.  No change in treatment needed. We briefly discussed the sleep apnea.  Once she starts using the CPAP she should feel more energetic and motivated as well.  Continue Wellbutrin XL 300 mg, 1 p.o. every morning. Continue Ativan 0.5 mg, 1-2 every 8 hours as needed anxiety.  Okay to refill when needed. Continue Effexor 100 mg, 2 p.o. every morning and 1 p.o. nightly. Continue multivitamin, omega red, vitamin D, and B complex daily. Continue therapy with Rinaldo Cloud, LCSW.  Return in 3 months.  Donnal Moat, PA-C

## 2021-12-09 NOTE — Progress Notes (Signed)
[facility] Counselor/Therapist Progress Note  Patient ID: Alexis Ibarra, MRN: [id number],    Date: 12/09/2021  Time Spent: 55 minutes   Treatment Type: Individual Therapy  Reported Symptoms:  anxiety   Mental Status Exam:  Appearance:   Casual and Neat     Behavior:  Appropriate, Sharing, and Motivated  Motor:  Normal  Speech/Language:   Clear and Coherent  Affect:  anxious  Mood:  anxious  Thought process:  goal directed  Thought content:    WNL  Sensory/Perceptual disturbances:    WNL  Orientation:  oriented to person, place, time/date, situation, day of week, month of year, year, and stated date of Oct. 16, 2023  Attention:  Good  Concentration:  Good  Memory:  WNL  Fund of knowledge:   Good  Insight:    Good and Fair  Judgment:   Good  Impulse Control:  Good   Risk Assessment: Danger to Self:  No Self-injurious Behavior: No Danger to Others: No Duty to Warn:no Physical Aggression / Violence:No  Access to Firearms a concern: No  Gang Involvement:No   Subjective:  Patient in today reporting anxiety re: family and personal issues. Had sleep test recently and is to get and use CPAP machine. Was more anxious during the sleep study and while waiting for results but better now. Reporting that her anxiety has decreased some. Worrying improved "a little" and needed to work more on that today especially as the worrying relates mostly to daughter and her 2 sons. Discussed effects of chronic worrying in session today and giving examples of how she she see it "benefitting me in some ways, but also being hurtful." Feels that it helps her to be alert to the negatives but after talking further today, she seemed to realize how it doesn't really benefit her in any way and can actually contribute to her negativity she feels. Worked with patient today on her tendency to be harsh and judgement  with herself and help her in arriving at healthier, more accurate thoughts/feelings about  herself. Working with challenging family situations and trying to feel healthier herself and make good decisions.  Continues to work on being a little more interactive with people and feel comfortable "when I am not exactly like others" and understanding that does not mean "that I am a misfit."  Picking up from last session, patient did speak a little more about a sensitive and confidential concern she brought up last session and reports less thinking about those concerns and feeling more connected and focused on the present.  It does seem that patient is feeling some continued gradual increase in her self-esteem and self-confidence.  Interventions: Cognitive Behavioral Therapy and Ego-Supportive   Treatment Goals: Goals remain on tx plan while patient works on strategies to reach her goals.  Progress is documented each session in "Progress" section of Plan. Long term goal: Reduce overall level, frequency, and intensity of anxiety so that daily functioning is not impaired. Short term goal: Increase understanding of beliefs and messages that produce worry and anxiety. Strategy: 1.Explore cognitive messages that generate anxiety and retrain in adaptive cognitions. 2.Develop behavioral and cognitive strategies to reduce or eliminate irrational anxiety.  Diagnosis:   ICD-10-CM   1. Generalized anxiety disorder  F41.1      Plan:  Today showing active participation and good motivation in session as she focused on personal and family issues, picking up from last session.  Is relieved that her sleep test is over  with and is waiting to get started with her CPAP machine.  Continued good participation in session and not dodging issues but rather being able to face them more directly especially the issues that relate to family concerns past and present.  Decreasing her negative self talk and working to challenge some of her former beliefs about herself in order to feel more encouraged about herself in the  present.  Coming gradually to the conclusion that "I am not a misfit" as that is what she used to believe.  Continue showing good connections in the present and gradual improvement with goal-directed behaviors.  Noticeably less self negating. Encouraged patient in her practice of positive behaviors as noted in sessions including: Interrupting excessive worrying and replace with more realistic/empowering thoughts, continue her work on letting go of things in the past that have been said within the family context earlier that she has held on for years and created resentment in the present, reflect more on her progress made, believing more in herself and her ability to make changes in her thinking and her behavior that will be positive for her going forward, stay in the present focusing what she can control, get outside daily and walk, staying in touch with people who are supportive, look for more positives versus negatives each day, remain on her prescribed medication, be open to looking at issues and other ways that might be more helpful for her and her healing, stop assuming worst-case scenarios, reduce overthinking and overanalyzing, consider that sometimes she does not fully understand what someone else is said or maybe took it the wrong way rather than making assumptions as to what they meant and be able to check it out with, challenging counteract her self-doubt, saying no without feeling guilty, letting go of things from the past that hold her back now, continue work on control issues, and realize the strength she shows when working with goal-directed behaviors to move in a direction that supports her improved emotional health.  Goal review and progress/challenges noted with patient.  Next appointment within 2 to 3 weeks.  This record has been created using [employer].  Chart creation errors have been sought, but may not always have been located and corrected.  Such creation errors do not reflect on  the standard of medical care provided.    Mathis Fare, LCSW

## 2022-01-13 ENCOUNTER — Ambulatory Visit: Payer: BC Managed Care – PPO | Admitting: Psychiatry

## 2022-01-13 DIAGNOSIS — F411 Generalized anxiety disorder: Secondary | ICD-10-CM | POA: Diagnosis not present

## 2022-01-13 NOTE — Progress Notes (Signed)
[facility] Counselor/Therapist Progress Note  Patient ID: Britne Borelli, MRN: [id number],    Date: 01/13/2022  Time Spent: 55 minutes   Treatment Type: Individual Therapy  Reported Symptoms: anxiety, some occasional "weepiness"   Mental Status Exam:  Appearance:   Casual     Behavior:  Appropriate, Sharing, and Motivated  Motor:  Normal  Speech/Language:   Clear and Coherent  Affect:  Appropriate; some anxiety  Mood:  anxious  Thought process:  goal directed  Thought content:    WNL  Sensory/Perceptual disturbances:    WNL  Orientation:  oriented to person, place, time/date, situation, day of week, month of year, year, and stated date of Nov. 20, 2023  Attention:  Good  Concentration:  Good  Memory:  WNL  Fund of knowledge:   Good  Insight:    Good  Judgment:   Good  Impulse Control:  Good   Risk Assessment: Danger to Self:  No Self-injurious Behavior: No Danger to Others: No Duty to Warn:no Physical Aggression / Violence:No  Access to Firearms a concern: No  Gang Involvement:No   Subjective:   Patient in today reporting anxiety, some difficulty "taking things the wrong way even when know I know better. Sleeping better with c-pap machine after about 35 days. Anxiety more related to personal, family, and my insecurities "but they have decreased a little."  Less chronic worrying, and trying to "be a person of concern versus worrying." Is "worried about upcoming wedding of husband's daughter as wedding got moved up due to bride's mom having cancer and not expected to live more than a few weeks. Worked more today on her "worrying" and increasing her strategies and efforts to decrease worrying. Less harsh with herself.Reports some improvement in self-confidence and self-esteem. Focus on improving her self-confidence.Self-acceptance big issue. Wants to work more specifically on feeling less self-conscious especially in groups of people and "have more self-acceptance using  strategies discussed to day.   Interventions: Cognitive Behavioral Therapy and Ego-Supportive  Long term goal: Reduce overall level, frequency, and intensity of anxiety so that daily functioning is not impaired. Short term goal: Increase understanding of beliefs and messages that produce worry and anxiety. Strategy: 1.Explore cognitive messages that generate anxiety and retrain in adaptive cognitions. 2.Develop behavioral and cognitive strategies to reduce or eliminate irrational anxiety.  Diagnosis:   ICD-10-CM   1. Generalized anxiety disorder  F41.1      Plan:  Patient showing active participation and motivation in session today focusing on some continued family and personal issues that she has been working on more intentionally.  Had stated previously last session that she felt she had reached the point of "not feeling like I am and misfit" however today acknowledged that she indeed had continued to feel that way but showed more motivation in working towards feeling better about herself and especially being able to be involved with other people more comfortably.  Discussed multiple strategies and working on this as well as her commitment level and her not giving up if it gets a little difficult and instead just be understanding of herself and keep trying at a level that she is able and moving forward. Encouraged patient in practicing more positive behaviors as noted in session including: Continued letting go of things in her past that have been said within the family context and she has held on to for years leading to increased resentment in the present, reflecting more on the progress she has made, believing more  in herself and her ability to make changes in her thinking and in her behavior that will be positive for her going forward, remain in the present focusing on what she can control, get outside daily and walk, staying in touch with people who are supportive, look for more positives  versus negatives each day, stay on her prescribed medication, be open to looking at issues in other ways that might be more helpful for her and her healing, refrain from assuming worst-case scenarios, reduce overthinking and overanalyzing, consider that sometimes she does not fully understand what someone else has said or maybe took it the wrong way and be able to check it out with them rather than go with her assumptions, challenging counteract her self-doubt, saying no without feeling guilty, letting go of things from the past that hold her back, continue working with control issues, and recognize the strength she shows working with goal-directed behaviors to move in a direction that supports her improved emotional health and overall outlook.  Goal reviewing progress/challenges noted with patient.  Next appointment within 3 to 4 weeks.  This record has been created using [employer].  Chart creation errors have been sought, but may not always have been located and corrected.  Such creation errors do not reflect on the standard of medical care provided.   Mathis Fare, LCSW

## 2022-03-10 ENCOUNTER — Ambulatory Visit: Payer: BC Managed Care – PPO | Admitting: Psychiatry

## 2022-03-10 ENCOUNTER — Ambulatory Visit: Payer: BC Managed Care – PPO | Admitting: Physician Assistant

## 2022-03-10 ENCOUNTER — Encounter: Payer: Self-pay | Admitting: Physician Assistant

## 2022-03-10 DIAGNOSIS — G4733 Obstructive sleep apnea (adult) (pediatric): Secondary | ICD-10-CM

## 2022-03-10 DIAGNOSIS — F3341 Major depressive disorder, recurrent, in partial remission: Secondary | ICD-10-CM

## 2022-03-10 DIAGNOSIS — F411 Generalized anxiety disorder: Secondary | ICD-10-CM | POA: Diagnosis not present

## 2022-03-10 MED ORDER — TRAZODONE HCL 100 MG PO TABS: 50.0000 mg | ORAL_TABLET | Freq: Every evening | ORAL | 1 refills | Status: DC | PRN

## 2022-03-10 MED ORDER — BUSPIRONE HCL 15 MG PO TABS: ORAL_TABLET | ORAL | 1 refills | Status: DC

## 2022-03-10 MED ORDER — VENLAFAXINE HCL 100 MG PO TABS: ORAL_TABLET | ORAL | 3 refills | Status: DC

## 2022-03-10 MED ORDER — LORAZEPAM 0.5 MG PO TABS: 0.2500 mg | ORAL_TABLET | Freq: Three times a day (TID) | ORAL | 0 refills | Status: DC | PRN

## 2022-03-10 NOTE — Progress Notes (Signed)
[facility] Counselor/Therapist Progress Note  Patient ID: Alexis Ibarra, MRN: [id number],    Date: 03/10/2022  Time Spent: 50 minutes   Treatment Type: Individual Therapy  Reported Symptoms: anxiety, worry, stressed  Mental Status Exam:  Appearance:   Casual and Neat     Behavior:  Appropriate, Sharing, and Motivated  Motor:  Normal  Speech/Language:   Clear and Coherent  Affect:  anxious  Mood:  anxious  Thought process:  goal directed  Thought content:    Rumination  Sensory/Perceptual disturbances:    WNL  Orientation:  oriented to person, place, time/date, situation, day of week, month of year, year, and stated date of Jan. 15, 2024  Attention:  Good  Concentration:  Good  Memory:  WNL  Fund of knowledge:   Good  Insight:    Good and Fair  Judgment:   Good  Impulse Control:  Good and Fair   Risk Assessment: Danger to Self:  No Self-injurious Behavior: No Danger to Others: No Duty to Warn:no Physical Aggression / Violence:No  Access to Firearms a concern: No  Gang Involvement:No   Subjective: Patient in session today reporting anxiety including worrying about everything and "thinks of things to worry about". Anxiety tends to be about family, personal, and her own insecurities, and we discussed several examples.  Fears the future, fears getting alzheimers as her grandmother had it, and working on decreasing her anxiety and fearfulness "but it's hard because it's been a habit." Step-daughter got married and was surprised all went well. Second-guesses herself a lot in situations with other people. Working on not choosing to see the negatives, "what might happen or go wrong", and trying to challenge her anxious thoughts, and not be so harsh with herself. Feels she is working to decrease her anxiety, increase her self-esteem and self-confidence, "but need to remember the things we work on, more in the situations I'm in." Some decrease in her self-consciousness around other  people and gave example of speaking individually to a lady she met recently who was within a group of people.   Interventions: Cognitive Behavioral Therapy, Solution-Oriented/Positive Psychology, and Ego-Supportive  Long term goal: Reduce overall level, frequency, and intensity of anxiety so that daily functioning is not impaired. Short term goal: Increase understanding of beliefs and messages that produce worry and anxiety. Strategy: 1.Explore cognitive messages that generate anxiety and retrain in adaptive cognitions. 2.Develop behavioral and cognitive strategies to reduce or eliminate irrational anxiety.  Diagnosis:   ICD-10-CM   1. Generalized anxiety disorder  F41.1      Plan: Patient working well today on goal directed behaviors as related to personal and family issues tat feed patient's symptoms. Improving very gradually in self-esteem and confidence. Worked more consistently on long-term fears and anxiety and noticing gradual improvement that needed more reinforcement today.  Continued work on self-acceptance and seeing some changes in positive direction. Not feeling as much like a "misfit". Encouraged to continue this work between sessions and seems to have a little more motivation. Is scheduling next appt sooner. Encouraged patient in her practice of positive behaviors as noted in session including: Recognizing and letting go of negative or destructive thought patterns from the past that can interrupt her moving forward now, reflect on progress made thus far, believing more in herself and the ability to make changes in her thinking and her behavior that will be positive for her, stay in the present focusing what she can control, staying in touch with people  who are supportive, remain on prescribed medication, be open to looking at issues and other ways that might be more helpful for her and her healing, refrain from assuming worst-case scenarios, reduce overthinking and over analyzing,  consider that sometimes she does not fully understand what someone else is said or maybe took it the wrong way and be able to check it out rather than assume negatives, challenge her self-doubt, saying no without feeling guilty, continue her work on control issues, and realize the strength she shows working with goal-directed behaviors to move in a direction that supports her improved emotional health and overall outlook.  Goal review and progress/challenges noted with patient.  Next appointment within approximately 1 month.  This record has been created using [employer].  Chart creation errors have been sought, but may not always have been located and corrected.  Such creation errors do not reflect on the standard of medical care provided.   Shanon Ace, LCSW

## 2022-03-10 NOTE — Progress Notes (Signed)
[facility] Med Check  Patient ID: Alexis Ibarra,  MRN: [id number]  PCP: Cyndi Bender, PA-C  Date of Evaluation: 03/10/2022 Time spent:30 minutes  Chief Complaint:  Chief Complaint   Anxiety; Depression; Insomnia; Follow-up    HISTORY/CURRENT STATUS: For routine med check.  Not sleeping well. Hard time going to sleep. Going on for several months. Staying asleep just fine. Uses CPAP as directed. No caffeine.   Gets overwhelmed easily.  Her daughter tells her that she worries too much.  She agrees, not having panic attacks.  She does not take the Ativan except before she has blood drawn.  Patient is able to enjoy things.  Energy and motivation are good.  No extreme sadness, tearfulness, or feelings of hopelessness. ADLs and personal hygiene are normal.   Denies any changes in concentration, making decisions, or remembering things.  Appetite has not changed.  Weight is stable.  Denies suicidal or homicidal thoughts.  Patient denies increased energy with decreased need for sleep, increased talkativeness, racing thoughts, impulsivity or risky behaviors, increased spending, increased libido, grandiosity, increased irritability or anger, paranoia, or hallucinations.  Denies dizziness, syncope, seizures, numbness, tingling, tremor, tics, unsteady gait, slurred speech, confusion. Denies muscle or joint pain, stiffness, or dystonia.  Individual Medical History/ Review of Systems: Changes? :No     Past medications for mental health diagnoses include: Celexa, Wellbutrin, Lexapro, Ativan  Allergies: Patient has no known allergies.  Current Medications:  Current Outpatient Medications:    aspirin 81 MG EC tablet, Take 1 tablet (81 mg total) by mouth daily., Disp: 30 tablet, Rfl: 6   b complex vitamins tablet, Take 1 tablet by mouth daily., Disp: 30 tablet, Rfl: 11   buPROPion (WELLBUTRIN XL) 300 MG 24 hr tablet, Take 1 tablet (300 mg total) by mouth daily., Disp: 90 tablet, Rfl: 1    busPIRone (BUSPAR) 15 MG tablet, 1/3 tablet twice daily for 1 week, then increase to 2/3 tablet twice daily for 1 week, then increase to 1 tablet twice daily for anxiety., Disp: 60 tablet, Rfl: 1   carvedilol (COREG) 3.125 MG tablet, TAKE 1 TABLET(3.125 MG) BY MOUTH TWICE DAILY WITH A MEAL., Disp: 180 tablet, Rfl: 3   diphenhydrAMINE (BENADRYL) 25 mg capsule, Take 25 mg by mouth at bedtime as needed., Disp: , Rfl:    ESTRADIOL-PROGESTERONE PO, Take by mouth., Disp: , Rfl:    losartan (COZAAR) 25 MG tablet, TAKE 1/2 TABLET BY MOUTH EVERY DAY, Disp: 45 tablet, Rfl: 3   MegaRed Omega-3 Krill Oil 500 MG CAPS, Take 500 mg by mouth daily., Disp: 30 capsule, Rfl: 11   Melatonin 5 MG CHEW, Chew 10 mg by mouth at bedtime., Disp: , Rfl:    metroNIDAZOLE (METROCREAM) 0.75 % cream, APPLY TO AFFECTED AREA TWICE A DAY, Disp: , Rfl:    rosuvastatin (CRESTOR) 5 MG tablet, TAKE 1 TABLET BY MOUTH EVERY EVENING (NEED APPOINTMENT FOR ANYMORE REFILLS FOR ALL MEDICATIONS), Disp: 90 tablet, Rfl: 3   traZODone (DESYREL) 100 MG tablet, Take 0.5-1 tablets (50-100 mg total) by mouth at bedtime as needed for sleep., Disp: 30 tablet, Rfl: 1   LORazepam (ATIVAN) 0.5 MG tablet, Take 0.5-1 tablets (0.25-0.5 mg total) by mouth every 8 (eight) hours as needed for anxiety., Disp: 30 tablet, Rfl: 0   Multiple Vitamins-Minerals (MULTIVITAMIN) tablet, Take 1 tablet by mouth daily. (Patient not taking: Reported on 10/21/2021), Disp: 30 tablet, Rfl: 11   nitroGLYCERIN (NITROSTAT) 0.4 MG SL tablet, Place 1 tablet (0.4 mg total) under the tongue  every 5 (five) minutes as needed for chest pain (up to 3 doses). (Patient not taking: Reported on 12/09/2021), Disp: 25 tablet, Rfl: 6   venlafaxine (EFFEXOR) 100 MG tablet, 2 po q am, 1 qhs, Disp: 270 tablet, Rfl: 3 Medication Side Effects: none  Family Medical/ Social History: Changes? No  MENTAL HEALTH EXAM:  There were no vitals taken for this visit.There is no height or weight on file to  calculate BMI.  General Appearance: Casual, Neat and Well Groomed  Eye Contact:  Good  Speech:  Clear and Coherent and Normal Rate  Volume:  Normal  Mood:  Euthymic  Affect:  Congruent  Thought Process:  Goal Directed and Descriptions of Associations: Circumstantial  Orientation:  Full (Time, Place, and Person)  Thought Content: Logical   Suicidal Thoughts:  No  Homicidal Thoughts:  No  Memory:  WNL  Judgement:  Good  Insight:  Good  Psychomotor Activity:  Normal  Concentration:  Concentration: Good and Attention Span: Good  Recall:  Good  Fund of Knowledge: Good  Language: Good  Assets:  Desire for Improvement Financial Resources/Insurance Housing Transportation  ADL's:  Intact  Cognition: WNL  Prognosis:  Good   DIAGNOSES:    ICD-10-CM   1. Obstructive sleep apnea  G47.33     2. 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 Generalized anxiety disorder  F41.1       Receiving Psychotherapy:  Yes with Rinaldo Cloud, LCSW  RECOMMENDATIONS: PDMP was reviewed.  Last Ativan prescription filled 12/12/2019.  Testosterone powder given 01/11/2022. I provided 30 minutes of face to face time during this encounter, including time spent before and after the visit in records review, medical decision making, counseling pertinent to today's visit, and charting.   Sleep hygiene discussed.  Recommend she take an Ativan approximately 2 hours before she needs to go to sleep.  She can do that for several nights and then stop it.  Continue melatonin for now.  We also discussed adding trazodone.  Benefits, risks, and side effects were discussed.  I am sending in a prescription that she can get if the Ativan is not effective.  Discussed the anxiety.  She is not having panic attacks but more of a generalized sense of unease, worrying about things that are not that important.  Recommend adding BuSpar.  Benefits, risks, side effects were discussed and she accepts.  Continue  Wellbutrin XL 300 mg, 1 p.o. every morning. Start BuSpar 15 mg 1/3 tablet twice daily for 1 week, then increase to 2/3 tablet twice daily for 1 week, then increase to 1 tablet twice daily for anxiety. Continue Ativan 0.5 mg, 1-2 every 8 hours as needed anxiety.  Okay to refill when needed. Start trazodone 100 mg, 1/2-1 p.o. nightly as needed sleep. Continue Effexor 100 mg, 2 p.o. every morning and 1 p.o. nightly. Continue multivitamin, omega red, vitamin D, and B complex daily. Continue therapy with Rinaldo Cloud, LCSW.  Return in 2 months.  Donnal Moat, PA-C

## 2022-04-01 ENCOUNTER — Other Ambulatory Visit: Payer: Self-pay | Admitting: Physician Assistant

## 2022-04-14 ENCOUNTER — Ambulatory Visit: Payer: BC Managed Care – PPO | Admitting: Psychiatry

## 2022-05-12 ENCOUNTER — Encounter: Payer: Self-pay | Admitting: Physician Assistant

## 2022-05-12 ENCOUNTER — Ambulatory Visit: Payer: BC Managed Care – PPO | Admitting: Physician Assistant

## 2022-05-12 ENCOUNTER — Ambulatory Visit: Payer: BC Managed Care – PPO | Admitting: Psychiatry

## 2022-05-12 DIAGNOSIS — G4733 Obstructive sleep apnea (adult) (pediatric): Secondary | ICD-10-CM | POA: Diagnosis not present

## 2022-05-12 DIAGNOSIS — F411 Generalized anxiety disorder: Secondary | ICD-10-CM | POA: Diagnosis not present

## 2022-05-12 DIAGNOSIS — F3341 Major depressive disorder, recurrent, in partial remission: Secondary | ICD-10-CM

## 2022-05-12 NOTE — Progress Notes (Signed)
[facility] Counselor/Therapist Progress Note  Patient ID: Alexis Ibarra, MRN: [id number],    Date: 05/12/2022  Time Spent: 50 minutes  Treatment Type: Individual Therapy  Reported Symptoms: anxiety  Mental Status Exam:  Appearance:   Casual and Neat     Behavior:  Appropriate, Sharing, and Motivated  Motor:  Normal  Speech/Language:   Clear and Coherent  Affect:  anxious  Mood:  anxious  Thought process:  goal directed  Thought content:    WNL  Sensory/Perceptual disturbances:    WNL  Orientation:  oriented to person, place, time/date, situation, day of week, month of year, year, and stated date of May 12, 2022  Attention:  Good  Concentration:  Good  Memory:  WNL  Fund of knowledge:   Good  Insight:    Good and Fair  Judgment:   Good  Impulse Control:  Good   Risk Assessment: Danger to Self:  No Self-injurious Behavior: No Danger to Others: No Duty to Warn:no Physical Aggression / Violence:No  Access to Firearms a concern: No  Gang Involvement:No   Subjective:  Patient in today and reporting anxiety but "I feel like I've made progress and can look back and see how I worried excessively and that didn't help. Not worrying as much but still worrying." Processed some of her anxieties today as well as strategies/behaviors/thought patterns that can help her progress further in decreasing and managing anxiety. Anxiety related mostly to daughter/grandkids/family and "present-oriented" things. Trying to "not worry about everything so much and not be thinking of things to worry about." States some improvement since she retired also. Still second-guessing herself some. Trying not to worry as much, including her fears of "getting alzheimers". Not as fearful of the future. Discussed her current anxieties in relation to family and herself. Worked on some self-care today and be able to make that choice. Second-guessing her self often, choosing to focus more on "what might go wrong  versus right", which we also worked on ways of stopping this habit and being more loving of herself versus harsh.  Reports that she is continuing to work on increasing her self-esteem and self-confidence, decrease her anxiety, and acknowledges that she does need to work harder on the strategies that we discussed in session and use them in her everyday life more often.   Interventions: Cognitive Behavioral Therapy and Ego-Supportive  Long term goal: Reduce overall level, frequency, and intensity of anxiety so that daily functioning is not impaired. Short term goal: Increase understanding of beliefs and messages that produce worry and anxiety. Strategy: 1.Explore cognitive messages that generate anxiety and retrain in adaptive cognitions. 2.Develop behavioral and cognitive strategies to reduce or eliminate irrational anxiety.  Diagnosis:   ICD-10-CM   1. Generalized anxiety disorder  F41.1      Plan:   Patient today actively participating on her anxiety, self-esteem, and tendency to excessively worry.  Is making progress in these areas and needs to continue using goal-directed behaviors and working in therapy in order to keep moving in a forward direction.  Some noticeable improvement in insight.  Some decrease in her fear of Alzheimer's.  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 Encouraged patient and practicing more positive and self affirming behaviors as noted in session including: Be able to reflect on the progress she has made so far, recognize and let go of negative and destructive thought patterns from the past that can interrupt  her moving forward now, believe more in herself and the ability to make changes in her thinking and behavior that will be positive for her, stay in the present focusing on what she can control, remain in touch with people who are supportive, stay on her prescribed medication, be open to looking at  issues in other ways that might be more helpful for her and her healing, refrain from assuming worst-case scenarios, reduce overthinking and over analyzing, consider that sometimes she does not fully understand what someone else is said or maybe took it the wrong way and be able to check it out rather than assume negatives, challenge her self-doubt, saying no without feeling guilty, continue her work on control issues, and recognize the strength she shows working with goal-directed behaviors to move in a direction that supports her improved emotional health and overall outlook.  Goal review and progress/challenges noted with patient.  Next appointment within 1 month.  This record has been created using [employer].  Chart creation errors have been sought, but may not always have been located and corrected.  Such creation errors do not reflect on the standard of medical care provided.   Shanon Ace, LCSW

## 2022-05-12 NOTE — Progress Notes (Signed)
[facility] Med Check  Patient ID: Alexis Ibarra,  MRN: [id number]  PCP: Cyndi Bender, PA-C  Date of Evaluation: 05/12/2022 Time spent:30 minutes  Chief Complaint:  Chief Complaint   Anxiety; Depression; Insomnia; Follow-up    HISTORY/CURRENT STATUS: For routine med check.  Not taking Ativan except when she has blood drawn.  She has passed out numerous times in the past.  Does not feel afraid but she passes out anyway.  She does have generalized anxiety at times but is hesitant to take the Ativan.  We added BuSpar 2 months ago and it has really helped with prevention of the anxiety.  Patient is able to enjoy things.  Enjoys spending time with her grandkids.  Is retired.  Energy and motivation are good.  No extreme sadness, tearfulness, or feelings of hopelessness.  Sleeps well now, she did not get the trazodone because she did not need it.  ADLs and personal hygiene are normal.   Denies any changes in concentration, making decisions, or remembering things.  Appetite has not changed.  Weight is stable.  Denies suicidal or homicidal thoughts.  Patient denies increased energy with decreased need for sleep, increased talkativeness, racing thoughts, impulsivity or risky behaviors, increased spending, increased libido, grandiosity, increased irritability or anger, paranoia, or hallucinations.  Denies dizziness, syncope, seizures, numbness, tingling, tremor, tics, unsteady gait, slurred speech, confusion. Denies muscle or joint pain, stiffness, or dystonia.  Individual Medical History/ Review of Systems: Changes? :No     Past medications for mental health diagnoses include: Celexa, Wellbutrin, Lexapro, Ativan  Allergies: Patient has no known allergies.  Current Medications:  Current Outpatient Medications:    aspirin 81 MG EC tablet, Take 1 tablet (81 mg total) by mouth daily., Disp: 30 tablet, Rfl: 6   b complex vitamins tablet, Take 1 tablet by mouth daily., Disp: 30 tablet, Rfl: 11    buPROPion (WELLBUTRIN XL) 300 MG 24 hr tablet, Take 1 tablet (300 mg total) by mouth daily., Disp: 90 tablet, Rfl: 1   busPIRone (BUSPAR) 15 MG tablet, Take 1 tablet twice daily for anxiety., Disp: 180 tablet, Rfl: 0   carvedilol (COREG) 3.125 MG tablet, TAKE 1 TABLET(3.125 MG) BY MOUTH TWICE DAILY WITH A MEAL., Disp: 180 tablet, Rfl: 3   ESTRADIOL-PROGESTERONE PO, Take by mouth., Disp: , Rfl:    LORazepam (ATIVAN) 0.5 MG tablet, Take 0.5-1 tablets (0.25-0.5 mg total) by mouth every 8 (eight) hours as needed for anxiety., Disp: 30 tablet, Rfl: 0   losartan (COZAAR) 25 MG tablet, TAKE 1/2 TABLET BY MOUTH EVERY DAY, Disp: 45 tablet, Rfl: 3   MegaRed Omega-3 Krill Oil 500 MG CAPS, Take 500 mg by mouth daily., Disp: 30 capsule, Rfl: 11   Melatonin 5 MG CHEW, Chew 10 mg by mouth at bedtime., Disp: , Rfl:    metroNIDAZOLE (METROCREAM) 0.75 % cream, APPLY TO AFFECTED AREA TWICE A DAY, Disp: , Rfl:    rosuvastatin (CRESTOR) 5 MG tablet, TAKE 1 TABLET BY MOUTH EVERY EVENING (NEED APPOINTMENT FOR ANYMORE REFILLS FOR ALL MEDICATIONS), Disp: 90 tablet, Rfl: 3   venlafaxine (EFFEXOR) 100 MG tablet, 2 po q am, 1 qhs, Disp: 270 tablet, Rfl: 3   nitroGLYCERIN (NITROSTAT) 0.4 MG SL tablet, Place 1 tablet (0.4 mg total) under the tongue every 5 (five) minutes as needed for chest pain (up to 3 doses). (Patient not taking: Reported on 12/09/2021), Disp: 25 tablet, Rfl: 6   traZODone (DESYREL) 100 MG tablet, Take 0.5-1 tablets (50-100 mg total) by mouth at  bedtime as needed for sleep. (Patient not taking: Reported on 05/12/2022), Disp: 30 tablet, Rfl: 1 Medication Side Effects: none  Family Medical/ Social History: Changes? No  MENTAL HEALTH EXAM:  There were no vitals taken for this visit.There is no height or weight on file to calculate BMI.  General Appearance: Casual, Neat and Well Groomed  Eye Contact:  Good  Speech:  Clear and Coherent and Normal Rate  Volume:  Normal  Mood:  Euthymic  Affect:  Congruent   Thought Process:  Goal Directed and Descriptions of Associations: Circumstantial  Orientation:  Full (Time, Place, and Person)  Thought Content: Logical   Suicidal Thoughts:  No  Homicidal Thoughts:  No  Memory:  WNL  Judgement:  Good  Insight:  Good  Psychomotor Activity:  Normal  Concentration:  Concentration: Good and Attention Span: Good  Recall:  Good  Fund of Knowledge: Good  Language: Good  Assets:  Desire for Improvement Financial Resources/Insurance Housing Transportation Vocational/Educational  ADL's:  Intact  Cognition: WNL  Prognosis:  Good   Labs 04/28/2022 at [facility] CBC with differential completely normal CMP glucose 84 BUN 14 and creatinine 0.85 electrolytes normal LFTs normal Lipid panel total cholesterol 147, triglycerides 95, HDL 58, LDL 71 TSH 3.2  DIAGNOSES:    ICD-10-CM   1. 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 Generalized anxiety disorder  F41.1     3. Obstructive sleep apnea  G47.33      Receiving Psychotherapy:  Yes with Rinaldo Cloud, LCSW  RECOMMENDATIONS: PDMP was reviewed.  Last Ativan prescription filled 03/10/2022.  Testosterone known to me. I provided 30 minutes of face to face time during this encounter, including time spent before and after the visit in records review, medical decision making, counseling pertinent to today's visit, and charting.   I am glad to see her doing so well!  No change in treatment needed.  Continue Wellbutrin XL 300 mg, 1 p.o. every morning. Continue BuSpar 15 mg, 1 tablet twice daily for anxiety. Continue Ativan 0.5 mg, 1-2 every 8 hours as needed anxiety.  Okay to refill when needed. Continue Effexor 100 mg, 2 p.o. every morning and 1 p.o. nightly. Continue multivitamin, omega red, vitamin D, and B complex daily. Continue therapy with Rinaldo Cloud, LCSW.  Return in 6 months.  Donnal Moat, PA-C

## 2022-06-15 ENCOUNTER — Other Ambulatory Visit: Payer: Self-pay | Admitting: Cardiovascular Disease

## 2022-06-16 ENCOUNTER — Ambulatory Visit: Payer: BC Managed Care – PPO | Admitting: Psychiatry

## 2022-06-24 ENCOUNTER — Other Ambulatory Visit: Payer: Self-pay | Admitting: Physician Assistant

## 2022-07-01 ENCOUNTER — Other Ambulatory Visit: Payer: Self-pay | Admitting: Physician Assistant

## 2022-07-07 ENCOUNTER — Ambulatory Visit: Payer: BC Managed Care – PPO | Admitting: Psychiatry

## 2022-07-07 DIAGNOSIS — F411 Generalized anxiety disorder: Secondary | ICD-10-CM

## 2022-07-07 NOTE — Progress Notes (Deleted)
      [facility] Counselor/Therapist Progress Note  Patient ID: Sheniyah Buzzetta, MRN: [id number],    Date: 07/07/2022  Time Spent: ***   Treatment Type: {CHL AMB THERAPY TYPES:[id number]}  Reported Symptoms: ***  Mental Status Exam:  Appearance:   {PSY:22683}     Behavior:  {PSY:[id number]}  Motor:  {PSY:22302}  Speech/Language:   {PSY:22685}  Affect:  {PSY:22687}  Mood:  {PSY:31886}  Thought process:  {PSY:31888}  Thought content:    {PSY:[id number]}  Sensory/Perceptual disturbances:    {PSY:[id number]}  Orientation:  {PSY:30297}  Attention:  {PSY:22877}  Concentration:  {PSY:989-262-9496}  Memory:  {PSY:662-487-2557}  Fund of knowledge:   {PSY:989-262-9496}  Insight:    {PSY:989-262-9496}  Judgment:   {PSY:989-262-9496}  Impulse Control:  {PSY:989-262-9496}   Risk Assessment: Danger to Self:  {PSY:22692} Self-injurious Behavior: {PSY:22692} Danger to Others: {PSY:22692} Duty to Warn:{PSY:311194} Physical Aggression / Violence:{PSY:21197} Access to Firearms a concern: {PSY:21197} Gang Involvement:{PSY:21197}  Subjective: ***   Interventions: {PSY:[id number]}  Diagnosis:No diagnosis found.  Plan: ***  Mathis Fare, LCSW

## 2022-07-07 NOTE — Progress Notes (Signed)
[facility] Counselor/Therapist Progress Note  Patient ID: Alexis Ibarra, MRN: [id number],    Date: 07/07/2022  Time Spent: 50 minutes   Treatment Type: Individual Therapy  Reported Symptoms: anxiety  Mental Status Exam:  Appearance:   Casual     Behavior:  Appropriate and Motivated  Motor:  Normal  Speech/Language:   Clear and Coherent  Affect:  anxious  Mood:  anxious  Thought process:  goal directed  Thought content:    WNL  Sensory/Perceptual disturbances:    WNL  Orientation:  oriented to person, place, time/date, situation, day of week, month of year, year, and stated date of Jul 07, 2022  Attention:  Good  Concentration:  Good  Memory:  WNL  Fund of knowledge:   Good  Insight:    Good and Fair  Judgment:   Good  Impulse Control:  Good   Risk Assessment: Danger to Self:  No Self-injurious Behavior: No Danger to Others: No Duty to Warn:no Physical Aggression / Violence:No  Access to Firearms a concern: No  Gang Involvement:No   Subjective:  Patient in session today reporting anxiety as main symptom and is mostly related to personal and family relationships. Acknowledges that she needs to look at the positives more than the negatives.States some of the issues with her father has held her back some, and worked more in session today on this.Also focusing on perfectionism seeing more negatives versus positives. Processed her thoughts about her "negatives" and helped re-frame them into do-able behaviors to work on changing. To work more on her worrying between session and try interrupting her self-negating more regularly and replace it with more self-accepting thoughts.  Still feeling that retiring was a good decision for her.  Does seconds gets herself some.  Encouraged more positive self-care and being less critical.  Tending to see the negative parts of herself more than positives, and second-guessing herself often, and anxiety is a component of that which we worked on  today using specific examples.  To follow-up on this with homework assignment.   Interventions: Cognitive Behavioral Therapy and Ego-Supportive Long term goal: Reduce overall level, frequency, and intensity of anxiety so that daily functioning is not impaired. Short term goal: Increase understanding of beliefs and messages that produce worry and anxiety. Strategy: 1.Explore cognitive messages that generate anxiety and retrain in adaptive cognitions. 2.Develop behavioral and cognitive strategies to reduce or eliminate irrational anxiety.  Diagnosis:   ICD-10-CM   1. Generalized anxiety disorder  F41.1      Plan:  Patient today participating well in session working on her anxiety and self criticalness, including some perfectionistic tendencies and seeing more the negatives versus positives which definitely impacts her mood negatively.  Has made some progress and needs to continue her work with goal-directed behaviors to move in a more forward direction. Encouraged patient in her practice of more positive and self affirming behaviors as noted in session including: Recognize and letting go of negative and destructive thought patterns from the past that can interrupt her moving forward, be able to reflect on the progress she has made so far, believe more in herself and the ability to make changes in her thinking and behavior that will be positive for her, stay in the present focusing on what she can control, stay in touch with people who are supportive, remain on her prescribed medication, be open to looking at issues in other ways that might be more helpful for her in her healing, refrain from  assuming worst-case scenarios, reduce overthinking and over analyzing, consider that sometimes she does not fully understand what someone else is said or maybe took it the wrong way and be able to check it out rather than assume negatives, challenge her self-doubt, saying no without feeling guilty, continue to  work on control issues, and recognize the strengths she shows working with goal-directed behaviors to move in a direction that supports her improved emotional health and overall wellbeing.  Goal review and progress/challenges noted with patient.  Next appointment within 4 to 6 weeks.   Mathis Fare, LCSW

## 2022-08-06 ENCOUNTER — Other Ambulatory Visit: Payer: Self-pay | Admitting: Cardiovascular Disease

## 2022-08-31 ENCOUNTER — Other Ambulatory Visit: Payer: Self-pay | Admitting: Cardiovascular Disease

## 2022-09-08 ENCOUNTER — Ambulatory Visit: Payer: BC Managed Care – PPO | Admitting: Psychiatry

## 2022-09-15 ENCOUNTER — Other Ambulatory Visit: Payer: Self-pay | Admitting: Cardiovascular Disease

## 2022-09-26 ENCOUNTER — Other Ambulatory Visit: Payer: Self-pay | Admitting: Physician Assistant

## 2022-10-01 ENCOUNTER — Other Ambulatory Visit: Payer: Self-pay | Admitting: Physician Assistant

## 2022-10-08 ENCOUNTER — Other Ambulatory Visit: Payer: Self-pay | Admitting: Cardiovascular Disease

## 2022-10-12 ENCOUNTER — Other Ambulatory Visit: Payer: Self-pay | Admitting: Cardiovascular Disease

## 2022-10-20 LAB — LAB REPORT - SCANNED
EGFR: 78
Free T4: 0.99 ng/dL
TSH: 2.17 (ref 0.41–5.90)

## 2022-10-22 ENCOUNTER — Other Ambulatory Visit: Payer: Self-pay | Admitting: Cardiovascular Disease

## 2022-11-01 ENCOUNTER — Other Ambulatory Visit: Payer: Self-pay | Admitting: Cardiovascular Disease

## 2022-11-06 ENCOUNTER — Other Ambulatory Visit: Payer: Self-pay | Admitting: Cardiovascular Disease

## 2022-11-10 ENCOUNTER — Ambulatory Visit: Payer: BC Managed Care – PPO | Admitting: Psychiatry

## 2022-11-10 ENCOUNTER — Encounter: Payer: Self-pay | Admitting: Physician Assistant

## 2022-11-10 ENCOUNTER — Ambulatory Visit: Payer: BC Managed Care – PPO | Admitting: Physician Assistant

## 2022-11-10 VITALS — BP 126/81 | HR 76

## 2022-11-10 DIAGNOSIS — F411 Generalized anxiety disorder: Secondary | ICD-10-CM

## 2022-11-10 DIAGNOSIS — F418 Other specified anxiety disorders: Secondary | ICD-10-CM

## 2022-11-10 DIAGNOSIS — F3341 Major depressive disorder, recurrent, in partial remission: Secondary | ICD-10-CM | POA: Diagnosis not present

## 2022-11-10 MED ORDER — LORAZEPAM 0.5 MG PO TABS: 0.2500 mg | ORAL_TABLET | Freq: Three times a day (TID) | ORAL | 5 refills | Status: DC | PRN

## 2022-11-10 MED ORDER — BUSPIRONE HCL 15 MG PO TABS: 15.0000 mg | ORAL_TABLET | Freq: Three times a day (TID) | ORAL | 3 refills | Status: DC

## 2022-11-10 NOTE — Progress Notes (Signed)
[facility] Med Check  Patient ID: Alexis Ibarra,  MRN: [id number]  PCP: Lonie Peak, PA-C  Date of Evaluation: 11/10/2022 Time spent:25 minutes  Chief Complaint:  Chief Complaint   Anxiety; Depression; Insomnia; Follow-up    HISTORY/CURRENT STATUS: For routine med check.  More anxious. Having PA occas, her Mom had open heart surgery since our last appt, so that's caused some anxiety. She's taking the Ativan more often which is very unusual for her.  She usually does not want to take it.  It does help her relax and does not cause drowsiness unless she takes it in the evening when her mind will not shut off.    Patient is able to enjoy things.  Energy and motivation are good.  She is retired.  No extreme sadness, tearfulness, or feelings of hopelessness.  Sleeps well most of the time. ADLs and personal hygiene are normal.   Denies any changes in concentration, making decisions, or remembering things.  Appetite has not changed.  Weight is stable.  Denies suicidal or homicidal thoughts.  Patient denies increased energy with decreased need for sleep, increased talkativeness, racing thoughts, impulsivity or risky behaviors, increased spending, increased libido, grandiosity, increased irritability or anger, paranoia, or hallucinations.  Denies dizziness, syncope, seizures, numbness, tingling, tremor, tics, unsteady gait, slurred speech, confusion. Denies muscle or joint pain, stiffness, or dystonia.  Individual Medical History/ Review of Systems: Changes? :Yes    Had labs recently at PCP. Vitamin D was high, per pt, and Vitamin D was d/c. Other labs ok.   Past medications for mental health diagnoses include: Celexa, Wellbutrin, Lexapro, Ativan, Trazodone caused constipation and made her 'feel weird.'  Allergies: Patient has no known allergies.  Current Medications:  Current Outpatient Medications:    aspirin 81 MG EC tablet, Take 1 tablet (81 mg total) by mouth daily., Disp: 30 tablet,  Rfl: 6   b complex vitamins tablet, Take 1 tablet by mouth daily., Disp: 30 tablet, Rfl: 11   buPROPion (WELLBUTRIN XL) 300 MG 24 hr tablet, TAKE 1 TABLET BY MOUTH EVERY DAY, Disp: 90 tablet, Rfl: 1   carvedilol (COREG) 3.125 MG tablet, TAKE 1 TABLET(3.125 MG) BY MOUTH TWICE DAILY WITH A MEAL., Disp: 15 tablet, Rfl: 0   ESTRADIOL-PROGESTERONE PO, Take by mouth., Disp: , Rfl:    losartan (COZAAR) 25 MG tablet, TAKE 1/2 TABLET BY MOUTH DAILY, Disp: 8 tablet, Rfl: 0   MegaRed Omega-3 Krill Oil 500 MG CAPS, Take 500 mg by mouth daily., Disp: 30 capsule, Rfl: 11   Melatonin 5 MG CHEW, Chew 10 mg by mouth at bedtime., Disp: , Rfl:    metroNIDAZOLE (METROCREAM) 0.75 % cream, APPLY TO AFFECTED AREA TWICE A DAY, Disp: , Rfl:    rosuvastatin (CRESTOR) 5 MG tablet, TAKE 1 TABLET BY MOUTH EVERY DAY IN THE EVENING, Disp: 90 tablet, Rfl: 1   venlafaxine (EFFEXOR) 100 MG tablet, 2 po q am, 1 qhs, Disp: 270 tablet, Rfl: 3   busPIRone (BUSPAR) 15 MG tablet, Take 1 tablet (15 mg total) by mouth 3 (three) times daily., Disp: 270 tablet, Rfl: 3   LORazepam (ATIVAN) 0.5 MG tablet, Take 0.5-1 tablets (0.25-0.5 mg total) by mouth every 8 (eight) hours as needed for anxiety., Disp: 30 tablet, Rfl: 5   nitroGLYCERIN (NITROSTAT) 0.4 MG SL tablet, Place 1 tablet (0.4 mg total) under the tongue every 5 (five) minutes as needed for chest pain (up to 3 doses). (Patient not taking: Reported on 12/09/2021), Disp: 25 tablet, Rfl: 6  Medication Side Effects: none  Family Medical/ Social History: Changes? Mom had open heart surgery a few weeks ago. Is doing well. That's been stressful.   MENTAL HEALTH EXAM:  Blood pressure 126/81, pulse 76.There is no height or weight on file to calculate BMI.  General Appearance: Casual, Neat and Well Groomed  Eye Contact:  Good  Speech:  Clear and Coherent and Normal Rate  Volume:  Normal  Mood:  Anxious  Affect:  Congruent  Thought Process:  Goal Directed and Descriptions of Associations:  Circumstantial  Orientation:  Full (Time, Place, and Person)  Thought Content: Logical   Suicidal Thoughts:  No  Homicidal Thoughts:  No  Memory:  WNL  Judgement:  Good  Insight:  Good  Psychomotor Activity:  Normal  Concentration:  Concentration: Good and Attention Span: Good  Recall:  Good  Fund of Knowledge: Good  Language: Good  Assets:  Desire for Improvement Financial Resources/Insurance Housing Transportation Vocational/Educational  ADL's:  Intact  Cognition: WNL  Prognosis:  Good   DIAGNOSES:    ICD-10-CM   1. Generalized anxiety disorder  F41.1     2. 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 Situational anxiety  F41.8       Receiving Psychotherapy:  Yes with Rockne Menghini, LCSW  RECOMMENDATIONS: PDMP was reviewed.  Testosterone filled 10/30/2022.  Last Ativan prescription filled 03/10/2022.  I provided 25 minutes of face to face time during this encounter, including time spent before and after the visit in records review, medical decision making, counseling pertinent to today's visit, and charting.   We discussed the BuSpar.  I recommend increasing it.  She would like to do so.  States she does not feel depressed anymore and wonders if we might be able to decrease or stop one of the other medications.  We can consider it but I prefer not to do that today so we will not be doing more than 1 thing at a time.  She understands.  Continue Wellbutrin XL 300 mg, 1 p.o. every morning. Increase Buspar to 15 mg, 2 po q am, 1 po at bedtime. Continue Ativan 0.5 mg, 1-2 every 8 hours as needed anxiety.  Continue Effexor 100 mg, 2 p.o. every morning and 1 p.o. nightly. Continue multivitamin, omega red, vitamin D, and B complex daily. Continue therapy with Rockne Menghini, LCSW.  Return in 2 months.  Melony Overly, PA-C

## 2022-11-10 NOTE — Progress Notes (Signed)
[facility] Counselor/Therapist Progress Note  Patient ID: Alexis Ibarra, MRN: [id number],    Date: 11/10/2022  Time Spent: 53 minutes  Treatment Type: Individual Therapy  Reported Symptoms:  anxiety, worries  Mental Status Exam:  Appearance:   Neat     Behavior:  Appropriate, Sharing, and Motivated  Motor:  Normal  Speech/Language:   Clear and Coherent  Affect:  anxious  Mood:  anxious  Thought process:  goal directed  Thought content:    WNL  Sensory/Perceptual disturbances:    WNL  Orientation:  oriented to person, place, time/date, situation, day of week, month of year, year, and stated date of Sept. 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 Feeling some anxiety and bitter-sweetness "over that being the last kid" to start school and now finding other ways to spend her time. Mother had open heart surgery, which was quite scary for patient but reports mother is progressing more now. Working more today on her anxiety and especially in seeing "what I can control and what I cannot control", and trying not to see and assume "the worst case scenarios". Trying to "get my anxiety under control" and is making some progress. Shared ways she feels she is trying "to control my anxiety" and I offered ways for her to work with her anxiety and "worry" in other ways versus control, and that could lead to her feeling less anxious and feel more confident in herself, while also becoming more self-confident in herself  and less judging.   Interventions: Cognitive Behavioral Therapy and Ego-Supportive  Long term goal: Reduce overall level, frequency, and intensity of anxiety so that daily functioning is not impaired. Short term goal: Increase understanding of beliefs and messages that produce worry and anxiety. Strategy: 1.Explore cognitive messages that generate anxiety and retrain in adaptive cognitions. 2.Develop behavioral and cognitive strategies to reduce or eliminate irrational anxiety.  Diagnosis:   ICD-10-CM   1. Generalized anxiety disorder  F41.1      Plan:  Patient today showing good participation in session as she focused more on her anxiety, and tendency to be self negating at times and quick to judge herself.  Did some good work on this in session today and to continue working on these issues in between sessions as encouraged at the end of session today.  Patient is making progress and needs to continue working with goal-directed behaviors to move in a more positive direction. Encouraged patient in her practice of more positive/self affirming behaviors as noted in sessions including: Recognize and letting go of negative and destructive thought patterns from the past that can interrupt her moving forward, be able to reflect on the progress she has made so far, believe more in herself and her ability to make changes in her thinking and behavior that will be positive for her, stay in the present focusing on what she can control, stay in touch with people who are supportive, remain  on her prescribed medication, be open to looking at issues in other ways that might be more helpful for her and her healing, refrain from assuming worst-case scenarios, reduce overthinking and over analyzing, consider that sometimes she does not fully understand what someone else is saying or maybe took it the wrong way and be able to check it out rather than assuming negatives, challenge her self-doubt, saying no without  feeling guilty, continue to work on control issues, and recognize the strength she shows working with goal-directed behaviors to move in a direction that supports her improved emotional health and overall wellbeing.  Goal review and progress/challenges noted with patient.  Next appt within approx. 6 weeks.   Mathis Fare, LCSW

## 2022-11-12 ENCOUNTER — Telehealth: Payer: Self-pay | Admitting: Physician Assistant

## 2022-11-13 NOTE — Telephone Encounter (Signed)
Patient was seen 9/16 and her buspirone was changed to 15 mg TID.  She wanted to verify.

## 2022-11-13 NOTE — Telephone Encounter (Signed)
Next visit is 01/08/23. Alexis Ibarra states that her Buspirone seems to have changed a little. Could someone please call her at [phone].

## 2022-11-23 ENCOUNTER — Other Ambulatory Visit: Payer: Self-pay | Admitting: Cardiovascular Disease

## 2022-12-01 ENCOUNTER — Other Ambulatory Visit: Payer: Self-pay | Admitting: Cardiovascular Disease

## 2022-12-02 ENCOUNTER — Telehealth: Payer: Self-pay | Admitting: Cardiovascular Disease

## 2022-12-02 MED ORDER — LOSARTAN POTASSIUM 25 MG PO TABS: 12.5000 mg | ORAL_TABLET | Freq: Every day | ORAL | 1 refills | Status: DC

## 2022-12-02 NOTE — Telephone Encounter (Signed)
She is scheduled 12/3 with Dr. Elease Hashimoto

## 2022-12-02 NOTE — Telephone Encounter (Signed)
*  STAT* If patient is at the pharmacy, call can be transferred to refill team.   1. Which medications need to be refilled? (please list name of each medication and dose if known)   losartan (COZAAR) 25 MG tablet      4. Which pharmacy/location (including street and city if local pharmacy) is medication to be sent to? [id number] - [place], [place] - [address] [facility]     5. Do they need a 30 day or 90 day supply? 90

## 2022-12-02 NOTE — Telephone Encounter (Signed)
Pt's medication was sent to pt's pharmacy as requested. Confirmation received.  °

## 2022-12-25 ENCOUNTER — Other Ambulatory Visit: Payer: Self-pay | Admitting: Cardiovascular Disease

## 2022-12-30 ENCOUNTER — Other Ambulatory Visit: Payer: Self-pay | Admitting: Physician Assistant

## 2022-12-30 NOTE — Telephone Encounter (Signed)
Please double check me, on 11/10/2022, I sent in #270 with 3 Rfs. So that's enough for a year. Not sure why this was even sent from pharmacy. Maybe they're using the old Rx #, which doesn't have RF.  I never understand this!

## 2022-12-30 NOTE — Telephone Encounter (Signed)
I changed sig per lv 09/16 note to  Increase Buspar to 15 mg, 2 po q am, 1 po at bedtime.  I didn't send it because I wasn't sure that disp amount should be for the 3 ms since it was changed and she has an appointment coming up. There has not been any telephone encounters with any concerns. APPT 11/14

## 2022-12-30 NOTE — Telephone Encounter (Signed)
You are correct. 09/16 270 wit 3 rf's.

## 2023-01-05 ENCOUNTER — Other Ambulatory Visit: Payer: Self-pay | Admitting: Cardiovascular Disease

## 2023-01-08 ENCOUNTER — Ambulatory Visit: Payer: BC Managed Care – PPO | Admitting: Physician Assistant

## 2023-01-08 ENCOUNTER — Ambulatory Visit: Payer: BC Managed Care – PPO | Admitting: Psychiatry

## 2023-01-13 ENCOUNTER — Other Ambulatory Visit: Payer: Self-pay | Admitting: Cardiovascular Disease

## 2023-01-25 ENCOUNTER — Encounter: Payer: Self-pay | Admitting: Cardiovascular Disease

## 2023-01-25 NOTE — Progress Notes (Signed)
Pt cancelled  This encounter was created in error - please disregard. 

## 2023-01-26 ENCOUNTER — Other Ambulatory Visit: Payer: Self-pay | Admitting: Cardiovascular Disease

## 2023-01-27 ENCOUNTER — Ambulatory Visit: Payer: BC Managed Care – PPO | Admitting: Cardiovascular Disease

## 2023-01-27 ENCOUNTER — Other Ambulatory Visit: Payer: Self-pay | Admitting: Cardiovascular Disease

## 2023-01-27 NOTE — Telephone Encounter (Signed)
*  STAT* If patient is at the pharmacy, call can be transferred to refill team.   1. Which medications need to be refilled? (please list name of each medication and dose if known)  carvedilol (COREG) 3.125 MG tablet   losartan (COZAAR) 25 MG tablet  rosuvastatin (CRESTOR) 5 MG tablet  2. Which pharmacy/location (including street and city if local pharmacy) is medication to be sent to? [id number] [place], [place] - [address] [facility] Phone: [phone]  Fax: [phone]     3. Do they need a 30 day or 90 day supply? Pt had to r/s he appt for 12/3 because of the weather. Earliest appt was March. I have added her to wait list.

## 2023-01-29 ENCOUNTER — Other Ambulatory Visit: Payer: Self-pay | Admitting: Cardiovascular Disease

## 2023-01-29 MED ORDER — LOSARTAN POTASSIUM 25 MG PO TABS: 12.5000 mg | ORAL_TABLET | Freq: Every day | ORAL | 0 refills | Status: DC

## 2023-01-29 MED ORDER — ROSUVASTATIN CALCIUM 5 MG PO TABS: ORAL_TABLET | ORAL | 0 refills | Status: DC

## 2023-01-29 MED ORDER — CARVEDILOL 3.125 MG PO TABS: ORAL_TABLET | ORAL | 0 refills | Status: DC

## 2023-01-29 NOTE — Telephone Encounter (Signed)
Called and spoke with patient. She states she had to cancel her appt three days ago because she keeps her grandchildren and school was cancelled due to snow and she had no way to bring them with her. Moved up her currently scheduled March appt to Jan 14,2025. Sent total of 60 days of medication to pharmacy as she would run out of a 30 day supply before appt.

## 2023-02-27 ENCOUNTER — Encounter: Payer: Self-pay | Admitting: Physician Assistant

## 2023-02-27 ENCOUNTER — Ambulatory Visit: Payer: 59 | Admitting: Physician Assistant

## 2023-02-27 ENCOUNTER — Ambulatory Visit: Payer: BC Managed Care – PPO | Admitting: Psychiatry

## 2023-02-27 DIAGNOSIS — F3341 Major depressive disorder, recurrent, in partial remission: Secondary | ICD-10-CM | POA: Diagnosis not present

## 2023-02-27 DIAGNOSIS — F411 Generalized anxiety disorder: Secondary | ICD-10-CM

## 2023-02-27 MED ORDER — BUPROPION HCL ER (XL) 300 MG PO TB24: 300.0000 mg | ORAL_TABLET | Freq: Every day | ORAL | 3 refills | Status: DC

## 2023-02-27 MED ORDER — VENLAFAXINE HCL 100 MG PO TABS: ORAL_TABLET | ORAL | 3 refills | Status: AC

## 2023-02-27 NOTE — Progress Notes (Signed)
 [facility] Counselor/Therapist Progress Note  Patient ID: Alexis Ibarra, MRN: [id number],    Date: 02/27/2023  Time Spent: 53 minutes   Treatment Type: Individual Therapy  Reported Symptoms: anxiety, second-guesses herself, overthinking   Mental Status Exam:  Appearance:   Casual and Neat     Behavior:  Appropriate, Sharing, and Motivated  Motor:  Normal  Speech/Language:   Clear and Coherent  Affect:  anxious  Mood:  anxious  Thought process:  goal directed  Thought content:    WNL  Sensory/Perceptual disturbances:    WNL  Orientation:  oriented to person, place, time/date, situation, day of week, month of year, year, and stated date of Jan.3, 2025  Attention:  Good  Concentration:  Good  Memory:  WNL  Fund of knowledge:   Good  Insight:    Good and Fair  Judgment:   Good  Impulse Control:  Good   Risk Assessment: Danger to Self:  No Self-injurious Behavior: No Danger to Others: No Duty to Warn:no Physical Aggression / Violence:No  Access to Firearms a concern: No  Gang Involvement:No   Subjective:  Patient today working further on her anxiety issues, along with some self-judging, worrying about other family members especially adult daughter where there is lots going on in her life and family. Some negative impact with a grandson, feeling responsible at times in negative and self-blaming ways, and talked through this more today and looked at how to change her negativity and self-blaming to view her self in more positive and heathy ways. Wants more self-confidence and not feel so insecure and discussed strategies today to help make that happen. Plans to intentionally use more positive self-talk and be less self-judging, and trying to make it a regular practice. Also focusing on stopping her tendency to assume worst case scenarios as discussed in session today, using interruption and replacements that are more positive and realistic.  Interventions: Cognitive Behavioral  Therapy and Ego-Supportive  Long term goal: Reduce overall level, frequency, and intensity of anxiety so that daily functioning is not impaired. Short term goal: Increase understanding of beliefs and messages that produce worry and anxiety. Strategy: 1.Explore cognitive messages that generate anxiety and retrain in adaptive cognitions. 2.Develop behavioral and cognitive strategies to reduce or eliminate irrational anxiety.   Diagnosis:   ICD-10-CM   1. Generalized anxiety disorder  F41.1      Plan:  Patient today in session showing active participation and motivation working further on her anxiety, stress management, regretting some of her behavior patterns that she feels are rubbing off on grandchildren, and self negating.  Seem to be more motivated in session today and believing a little more in herself that she can make certain behavior changes whereas previously she has often seem to think that some of her less helpful behaviors in the past have been practice so long it would be hard to change.  Concerned about other family members also and some upcoming changes which she discussed in more detail and seemed helpful to her.  Boundary issues reviewed.  Patient has shown progress and needs to continue working with goal-directed behaviors to move in a positive and healthier direction.  Reminded and encouraged patient to be practicing more positive/self affirming behaviors as noted in sessions including: Recognize and letting go of negative/destructive thought patterns from the past that interrupts her moving forward, be able to reflect on the progress she has made so far, believe more in herself and her ability to make  changes in her thinking and behavior that will be positive for her, stay in the present focusing on what she can control or change, stay in touch with people who are supportive, remain on prescribed medications, be open to looking at issues in other ways that might be more helpful for  her and her healing, refrain from assuming worst-case scenarios, reduce overthinking and over analyzing, consider that sometimes she does not fully understand what someone else is saying or maybe took it the wrong way and be able to check it out rather than assuming negatives, challenge her self-doubt, say no without feeling guilty, continue her work on control issues, and realize the strength she shows when working with goal-directed behaviors to move in a direction that supports her improved emotional health and her outlook into the future.  Goal review and progress/challenges noted with patient.  Next appointment within 6 weeks.   Barnie Bunde, LCSW

## 2023-02-27 NOTE — Progress Notes (Signed)
 [facility]  Patient ID: Alexis Ibarra,  MRN: [id number]  PCP: Montey Lot, PA-C  Date of Evaluation: 02/27/2023 Time spent:20 minutes  Chief Complaint:  Chief Complaint   Anxiety; Follow-up    HISTORY/CURRENT STATUS: For routine med check.  A couple of months ago we increased the BuSpar .  She states the pharmacist told her over 30 mg is too high of a dose.  She was nervous about it and did not feel like she really needed the increase so she is stuck with 15 mg twice daily.  Overall she is doing well and only needs the Ativan  when she has blood drawn.  Patient is able to enjoy things.  Energy and motivation are good.  No extreme sadness, tearfulness, or feelings of hopelessness.  Sleeps well most of the time. ADLs and personal hygiene are normal.   Denies any changes in concentration, making decisions, or remembering things.  Appetite has not changed.  Weight is stable.   Denies suicidal or homicidal thoughts.  Denies dizziness, syncope, seizures, numbness, tingling, tremor, tics, unsteady gait, slurred speech, confusion. Denies muscle or joint pain, stiffness, or dystonia.  Individual Medical History/ Review of Systems: Changes? :No      Past medications for mental health diagnoses include: Celexa, Wellbutrin , Lexapro, Ativan , Trazodone  caused constipation and made her 'feel weird.'  Allergies: Patient has no known allergies.  Current Medications:  Current Outpatient Medications:    aspirin  81 MG EC tablet, Take 1 tablet (81 mg total) by mouth daily., Disp: 30 tablet, Rfl: 6   b complex vitamins tablet, Take 1 tablet by mouth daily., Disp: 30 tablet, Rfl: 11   busPIRone  (BUSPAR ) 15 MG tablet, Take 1 tablet (15 mg total) by mouth 3 (three) times daily. (Patient taking differently: Take 15 mg by mouth 2 (two) times daily.), Disp: 270 tablet, Rfl: 3   carvedilol  (COREG ) 3.125 MG tablet, TAKE 1 TABLET(3.125 MG) BY MOUTH TWICE DAILY WITH A MEAL., Disp: 120 tablet, Rfl: 0    ESTRADIOL-PROGESTERONE PO, Take by mouth., Disp: , Rfl:    LORazepam  (ATIVAN ) 0.5 MG tablet, Take 0.5-1 tablets (0.25-0.5 mg total) by mouth every 8 (eight) hours as needed for anxiety., Disp: 30 tablet, Rfl: 5   losartan  (COZAAR ) 25 MG tablet, TAKE 1/2 TABLET BY MOUTH DAILY, Disp: 45 tablet, Rfl: 3   MegaRed Omega-3 Krill Oil 500 MG CAPS, Take 500 mg by mouth daily., Disp: 30 capsule, Rfl: 11   Melatonin 5 MG CHEW, Chew 10 mg by mouth at bedtime., Disp: , Rfl:    metroNIDAZOLE (METROCREAM) 0.75 % cream, APPLY TO AFFECTED AREA TWICE A DAY, Disp: , Rfl:    rosuvastatin  (CRESTOR ) 5 MG tablet, TAKE 1 TABLET BY MOUTH EVERY DAY IN THE EVENING, Disp: 90 tablet, Rfl: 3   buPROPion  (WELLBUTRIN  XL) 300 MG 24 hr tablet, Take 1 tablet (300 mg total) by mouth daily., Disp: 90 tablet, Rfl: 3   nitroGLYCERIN  (NITROSTAT ) 0.4 MG SL tablet, Place 1 tablet (0.4 mg total) under the tongue every 5 (five) minutes as needed for chest pain (up to 3 doses). (Patient not taking: Reported on 02/27/2023), Disp: 25 tablet, Rfl: 6   venlafaxine  (EFFEXOR ) 100 MG tablet, 2 po q am, 1 qhs, Disp: 270 tablet, Rfl: 3 Medication Side Effects: none  Family Medical/ Social History: Changes? Mom had open heart surgery a few weeks ago. Is doing well. That's been stressful.   MENTAL HEALTH EXAM:  There were no vitals taken for this visit.There is no height  or weight on file to calculate BMI.  General Appearance: Casual, Neat and Well Groomed  Eye Contact:  Good  Speech:  Clear and Coherent and Normal Rate  Volume:  Normal  Mood:  Euthymic  Affect:  Congruent  Thought Process:  Goal Directed and Descriptions of Associations: Circumstantial  Orientation:  Full (Time, Place, and Person)  Thought Content: Logical   Suicidal Thoughts:  No  Homicidal Thoughts:  No  Memory:  WNL  Judgement:  Good  Insight:  Good  Psychomotor Activity:  Normal  Concentration:  Concentration: Good and Attention Span: Good  Recall:  Good  Fund of  Knowledge: Good  Language: Good  Assets:  Desire for Improvement Financial Resources/Insurance Housing Transportation Vocational/Educational  ADL's:  Intact  Cognition: WNL  Prognosis:  Good   DIAGNOSES:    ICD-10-CM   1. Generalized anxiety disorder  F41.1     2. 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  Testosterone filled 10/30/2022.  Last Ativan  prescription filled 11/10/2022. I provided  20 minutes of face to face time during this encounter, including time spent before and after the visit in records review, medical decision making, counseling pertinent to today's visit, and charting.   She is doing well on the BuSpar  twice daily so no change.  Continue Wellbutrin  XL 300 mg, 1 p.o. every morning. Continue Buspar  15 mg, 1 po bid. Continue Ativan  0.5 mg, 1-2 every 8 hours as needed anxiety.  (Okay to take up to 3 before she has blood drawn as long as she has a driver.) continue Effexor  100 mg, 2 p.o. every morning and 1 p.o. nightly. Continue multivitamin, omega red, vitamin D, and B complex daily. Continue therapy with Marval Bunde, LCSW.  Return in 4 months.  Verneita Cooks, PA-C

## 2023-03-07 ENCOUNTER — Encounter: Payer: Self-pay | Admitting: Cardiovascular Disease

## 2023-03-07 NOTE — Progress Notes (Signed)
 Cardiology Office Note:    Date:  03/10/2023   ID:  Alexis Ibarra, DOB [birth year], MRN [id number]  PCP:  Montey Lot, PA-C  Cardiologist:  Aleene Passe, MD  Electrophysiologist:  None   Referring MD: Montey Lot, PA-C   Chief Complaint  Patient presents with   takotsubo syndrome   Hyperlipidemia  Takotsubo Syndrome  Previous notes:   Alexis Ibarra is a 58 y.o. female with a hx of Takotsubo syndrome.  Seen with husband, [place].   She was admitted to the hospital several weeks ago.  She had minimal troponin levels.  Cardiac cath revealed very minimal coronary artery disease but she had left ventriculogram that was consistent with takotsubo syndrome.  We started Coreg  Has had some fatigue for a few days but this has resolved.  Has not been exercising.  December 07, 2018: Alexis Ibarra seen today for follow-up visit.  She was hospitalized with chest pain and positive troponin levels.  She was thought to have Takotsubo  Syndrome.  Has had 1 episode of CP .  Took 1 NTG  No further CP  Has retired from work ( worked in the [employer] office )   Jan. 31, 2022 [name] is seen for follow up visit for her Takotsubo syndrome in 2019 Her last echo shows normal LV function  brought labs from urology visit  BMP, LFTs, were stable Chol =149 Trigs = 91 HDL = 63 LDL = 69   tsh is normal   Jun 24, 2021: Alexis Ibarra is seen today for follow-up visit.  She had what is most likely a Takotsubo syndrome several years ago. Catheterization November, 2019 reveals a 40% right PDA stenosis.  She had normal left ventricular systolic function.  She brought labs from [facility].  Her CBC is within normal limits.  C-Met is normal.  Potassium is 4.3.  Creatinine 0.91.  Glucose is 79.  TSH is 2.48.  Vitamin B12 level is normal.  Lipid panel   Total cholesterol 140 Triglyceride level 61 HDL 61 LDL 66   Dec. 3, 2024  Pt rescheduled    Jan. 14, 2025   March is seen for follow up of Takotsubo syndrome  Echo  from 2019 reveals Normal LV function with EF 60-65% Trivial AI  Walks on occasion  She had a probable panic attach after her mothers cath .  She is on low dose Coreg , Losartan       Past Medical History:  Diagnosis Date   Acne    Anxiety    Depression    Hematochezia    Takotsubo cardiomyopathy    a. NSTEMI 12/2017 -> cath with normal coronaries, preserved EF but apical ballooning suggestive of Takotsubo cardiomyopathy.    Past Surgical History:  Procedure Laterality Date   COLONOSCOPY  09/21/2014   Moderate predominantly sigmoid diverticulosis. Otherwise normal colonoscopy   LEFT HEART CATH AND CORONARY ANGIOGRAPHY N/A 01/19/2018   Procedure: LEFT HEART CATH AND CORONARY ANGIOGRAPHY;  Surgeon: Anner Alm ORN, MD;  Location: [facility] INVASIVE CV LAB;  Service: Cardiovascular;  Laterality: N/A;    Current Medications: Current Meds  Medication Sig   aspirin  81 MG EC tablet Take 1 tablet (81 mg total) by mouth daily.   b complex vitamins tablet Take 1 tablet by mouth daily.   buPROPion  (WELLBUTRIN  XL) 300 MG 24 hr tablet Take 1 tablet (300 mg total) by mouth daily.   busPIRone  (BUSPAR ) 15 MG tablet Take 1 tablet (15 mg total) by mouth 3 (three) times daily. (Patient taking  differently: Take 15 mg by mouth 2 (two) times daily.)   carvedilol  (COREG ) 3.125 MG tablet TAKE 1 TABLET(3.125 MG) BY MOUTH TWICE DAILY WITH A MEAL.   doxycycline  (ORACEA ) 40 MG capsule Take 40 mg by mouth every morning.   ESTRADIOL-PROGESTERONE PO Take by mouth.   LORazepam  (ATIVAN ) 0.5 MG tablet Take 0.5-1 tablets (0.25-0.5 mg total) by mouth every 8 (eight) hours as needed for anxiety.   losartan  (COZAAR ) 25 MG tablet TAKE 1/2 TABLET BY MOUTH DAILY   MegaRed Omega-3 Krill Oil 500 MG CAPS Take 500 mg by mouth daily.   Melatonin 5 MG CHEW Chew 10 mg by mouth at bedtime.   metroNIDAZOLE (METROCREAM) 0.75 % cream APPLY TO AFFECTED AREA TWICE A DAY   nitroGLYCERIN  (NITROSTAT ) 0.4 MG SL tablet Place 1 tablet (0.4  mg total) under the tongue every 5 (five) minutes as needed for chest pain (up to 3 doses).   rosuvastatin  (CRESTOR ) 5 MG tablet TAKE 1 TABLET BY MOUTH EVERY DAY IN THE EVENING   venlafaxine  (EFFEXOR ) 100 MG tablet 2 po q am, 1 qhs     Allergies:   Patient has no known allergies.   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  4 years.     Has a 59 yo dtr from previous marriage. 2 grandsons.  2 stepdtrs.   Grew up with both parents in the home.  They've been married 54 years.  Has a younger brother.    Grew up in [place], [place] and still lives there.    Went to [employer].  Plans to retire in Oct. And will help out family.      Christian      No legal issues       Caffeine 1-2 coffee/day.    Social Drivers of Corporate Investment Banker Strain: Low Risk  (06/23/2018)   Overall Financial Resource Strain (CARDIA)    Difficulty of Paying Living Expenses: Not hard at all  Food Insecurity: No Food Insecurity (06/23/2018)   Hunger Vital Sign    Worried About Running Out of Food in the Last Year: Never true    Ran Out of Food in the Last Year: Never true  Transportation Needs: No Transportation Needs (06/23/2018)   PRAPARE - Administrator, Civil Service (Medical): No    Lack of Transportation (Non-Medical): No  Physical Activity: Inactive (06/23/2018)   Exercise Vital Sign    Days  of Exercise per Week: 0 days    Minutes of Exercise per Session: 0 min  Stress: Stress Concern Present (06/23/2018)   [employer] of Occupational Health - Occupational Stress Questionnaire    Feeling of Stress : Rather much  Social Connections: Moderately Integrated (06/23/2018)   Social Connection and Isolation Panel [NHANES]  Frequency of Communication with Friends and Family: Twice a week    Frequency of Social Gatherings with Friends and Family: Twice a week    Attends Religious Services: More than 4 times per year    Active Member of [employer] or Organizations: No    Attends Engineer, Structural: Never    Marital Status: Married     Family History: The patient's family history includes CAD in her maternal grandfather; Cirrhosis in her father; Colon polyps in her father; Colonic polyp in her father; Dementia in her maternal grandmother and paternal grandmother; Depression in her daughter; Healthy in her brother; Heart failure in her paternal grandfather; Hypertension in her father; Hypothyroidism in her mother; Kidney disease in her father; Liver cancer in her father; Prostate cancer in her paternal grandfather. There is no history of Colon cancer, Pancreatic cancer, Rectal cancer, Stomach cancer, or Esophageal cancer.  ROS:   Please see the history of present illness.     All other systems reviewed and are negative.  EKGs/Labs/Other Studies Reviewed:    The following studies were reviewed today:     Recent Labs: No results found for requested labs within last 365 days.  Recent Lipid Panel    Component Value Date/Time   CHOL 144 12/07/2018 0947   TRIG 57 12/07/2018 0947   HDL 63 12/07/2018 0947   CHOLHDL 2.3 12/07/2018 0947   CHOLHDL 2.8 01/19/2018 1047   VLDL 8 01/19/2018 1047   LDLCALC 69 12/07/2018 0947   Physical Exam: Blood pressure 136/86, pulse 74, height 5' 2 (1.575 m), weight 139 lb (63 kg), SpO2 97%.       GEN:  Well nourished, well developed  in no acute distress HEENT: Normal NECK: No JVD; No carotid bruits LYMPHATICS: No lymphadenopathy CARDIAC: RRR , no murmurs, rubs, gallops RESPIRATORY:  Clear to auscultation without rales, wheezing or rhonchi  ABDOMEN: Soft, non-tender, non-distended MUSCULOSKELETAL:  No edema; No deformity  SKIN: Warm and dry NEUROLOGIC:  Alert and oriented x 3   EKG:   EKG Interpretation Date/Time:  Tuesday March 10 2023 12:05:45 EST Ventricular Rate:  90 PR Interval:  128 QRS Duration:  84 QT Interval:  356 QTC Calculation: 435 R Axis:   25  Text Interpretation: Normal sinus rhythm Normal ECG When compared with ECG of 19-Jan-2018 04:51, QRS axis Shifted right Nonspecific T wave abnormality no longer evident in Lateral leads Confirmed by Alveta Mungo ([id number]) on 03/10/2023 12:27:34 PM     ASSESSMENT:    1. Takotsubo cardiomyopathy      PLAN:     1.  Takotsubo syndrome:    She is doing very well.  She has not had any recurrent symptoms of Takotsubo syndrome.  Continue carvedilol  and losartan  she has mild CAD.  Continue to   2.  Mild coronary artery disease:   her last LDL was 70.  Cont current medication s   3.  Anxiety:  . Follow up with her primary MD     Medication Adjustments/Labs and Tests Ordered: Current medicines are reviewed at length with the patient today.  Concerns regarding medicines are outlined above.  Orders Placed This Encounter  Procedures   EKG 12-Lead   No orders of the defined types were placed in this encounter.  We will have her see us  again in 1 year.  Patient Instructions  Follow-Up: At [facility], you and your health needs are our priority.  As part of our continuing mission to provide you with  exceptional heart care, we have created designated Provider Care Teams.  These Care Teams include your primary Cardiologist (physician) and Advanced Practice Providers (APPs -  Physician Assistants and Nurse Practitioners) who all work together to  provide you with the care you need, when you need it.   Your next appointment:   1 year(s)  Provider:   Maude Emmer, MD            Signed, Aleene Passe, MD  03/10/2023 12:29 PM    [facility]

## 2023-03-10 ENCOUNTER — Encounter: Payer: Self-pay | Admitting: Cardiovascular Disease

## 2023-03-10 ENCOUNTER — Ambulatory Visit: Payer: 59 | Attending: Cardiovascular Disease | Admitting: Cardiovascular Disease

## 2023-03-10 VITALS — BP 136/86 | HR 74 | Ht 62.0 in | Wt 139.0 lb

## 2023-03-10 DIAGNOSIS — I5181 Takotsubo syndrome: Secondary | ICD-10-CM

## 2023-03-10 NOTE — Patient Instructions (Signed)
 Follow-Up: At [facility], you and your health needs are our priority.  As part of our continuing mission to provide you with exceptional heart care, we have created designated Provider Care Teams.  These Care Teams include your primary Cardiologist (physician) and Advanced Practice Providers (APPs -  Physician Assistants and Nurse Practitioners) who all work together to provide you with the care you need, when you need it.  Your next appointment:   1 year(s)  Provider:   Charlton Haws, MD

## 2023-04-28 ENCOUNTER — Ambulatory Visit: Payer: BC Managed Care – PPO | Admitting: Cardiovascular Disease

## 2023-06-01 ENCOUNTER — Ambulatory Visit: Admitting: Orthopaedic Surgery

## 2023-06-01 DIAGNOSIS — M1612 Unilateral primary osteoarthritis, left hip: Secondary | ICD-10-CM | POA: Insufficient documentation

## 2023-06-01 DIAGNOSIS — M25552 Pain in left hip: Secondary | ICD-10-CM

## 2023-06-01 NOTE — Progress Notes (Signed)
 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  She is only been hurting though since about March when she fell on that hip wrestling with grandkids.  She is very active.  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  She does report groin pain and stiffness with her left hip.  She is also on hormonal therapy and the person who provides that medication is no longer to do so states she is kind of stretch things out.  She is not obese.  She is not a diabetic.  I was able to review all of her past medical history and medications within epic.  She does have high blood pressure and does see Dr. Elease Hashimoto from cardiology.  Examination shows that she does walk with a limp.  There is a slight leg length difference with her left side shorter than the right.  She has stiffness with internal and external Tatian of her left hip with significant pain in the groin with left hip exam.  The right hip exam is normal.  X-rays of the company of her of the pelvis and left hip show bone-on-bone end-stage arthritis of the left hip.  There is complete loss of superior lateral joint space with flattening of the femoral head and sclerotic changes.  There are also small osteophytes.  The left hip appears normal.  We had a long and thorough discussion about hip replacement surgery.  We discussed the risks and benefits of the surgery and what to expect from an intraoperative and postoperative standpoint.  I went over her x-rays with her and a hip replacement model and gave her handout about hip replacement surgery.  She feels like she is not ready just yet and we talked about activity modification and anti-inflammatories as well as Tylenol and using a cane or opposite hand.  If he gets to where it is affecting her on a daily basis and he is detriment affecting her quality of life, mobility and her actives daily living, she will  let us know and proceed with hip replacement surgery.  All questions and concerns were answered and addressed.  Follow-up otherwise as as needed.

## 2023-06-22 ENCOUNTER — Telehealth: Payer: Self-pay

## 2023-06-22 NOTE — Telephone Encounter (Signed)
 Patient left voice mail to inquire about THA.  She may want to go ahead and schedule.  Wants to know how much down time she should expect.  [phone]

## 2023-06-24 ENCOUNTER — Other Ambulatory Visit: Payer: Self-pay | Admitting: Cardiovascular Disease

## 2023-06-29 ENCOUNTER — Ambulatory Visit: Payer: 59 | Admitting: Psychiatry

## 2023-06-29 ENCOUNTER — Encounter: Payer: Self-pay | Admitting: Physician Assistant

## 2023-06-29 ENCOUNTER — Ambulatory Visit: Payer: 59 | Admitting: Physician Assistant

## 2023-06-29 DIAGNOSIS — F411 Generalized anxiety disorder: Secondary | ICD-10-CM

## 2023-06-29 DIAGNOSIS — G4733 Obstructive sleep apnea (adult) (pediatric): Secondary | ICD-10-CM

## 2023-06-29 DIAGNOSIS — F3341 Major depressive disorder, recurrent, in partial remission: Secondary | ICD-10-CM | POA: Diagnosis not present

## 2023-06-29 MED ORDER — LORAZEPAM 0.5 MG PO TABS: 0.2500 mg | ORAL_TABLET | Freq: Three times a day (TID) | ORAL | 5 refills | Status: AC | PRN

## 2023-06-29 NOTE — Progress Notes (Signed)
 [facility]  Patient ID: Alexis Ibarra,  MRN: [id number]  PCP: Aloha Arnold, PA-C  Date of Evaluation: 06/29/2023 Time spent:20 minutes  Chief Complaint:  Chief Complaint   Anxiety; Depression; Follow-up    HISTORY/CURRENT STATUS: For routine med check.  Doing well as far as her meds go. Patient is able to enjoy things.  Energy and motivation are good.  She is retired.  No extreme sadness, tearfulness, or feelings of hopelessness.  Doesn't sleep well, but partly d/t hip pain.  She is not using her CPAP.   ADLs and personal hygiene are normal.   Denies any changes in concentration, making decisions, or remembering things.  Appetite has not changed.  Weight is stable.  Denies suicidal or homicidal thoughts.  Patient denies increased energy with decreased need for sleep, increased talkativeness, racing thoughts, impulsivity or risky behaviors, increased spending, increased libido, grandiosity, increased irritability or anger, paranoia, or hallucinations.  Denies dizziness, syncope, seizures, numbness, tingling, tremor, tics, unsteady gait, slurred speech, confusion.  She has chronic hip pain.  We will probably have a replacement over the summer.  Denies dystonia.  Individual Medical History/ Review of Systems: Changes? :Yes    will have hip replacement  Past medications for mental health diagnoses include: Celexa, Wellbutrin , Lexapro, Ativan , Trazodone  caused constipation and made her 'feel weird.'  Allergies: Patient has no known allergies.  Current Medications:  Current Outpatient Medications:    aspirin  81 MG EC tablet, Take 1 tablet (81 mg total) by mouth daily., Disp: 30 tablet, Rfl: 6   buPROPion  (WELLBUTRIN  XL) 300 MG 24 hr tablet, Take 1 tablet (300 mg total) by mouth daily., Disp: 90 tablet, Rfl: 3   busPIRone  (BUSPAR ) 15 MG tablet, Take 1 tablet (15 mg total) by mouth 3 (three) times daily. (Patient taking differently: Take 15 mg by mouth 2 (two) times daily.),  Disp: 270 tablet, Rfl: 3   carvedilol  (COREG ) 3.125 MG tablet, TAKE 1 TABLET(3.125 MG) BY MOUTH TWICE DAILY WITH A MEAL., Disp: 180 tablet, Rfl: 3   doxycycline  (ORACEA ) 40 MG capsule, Take 40 mg by mouth every morning., Disp: , Rfl:    ESTRADIOL-PROGESTERONE PO, Take by mouth., Disp: , Rfl:    losartan  (COZAAR ) 25 MG tablet, TAKE 1/2 TABLET BY MOUTH DAILY, Disp: 45 tablet, Rfl: 3   MegaRed Omega-3 Krill Oil 500 MG CAPS, Take 500 mg by mouth daily., Disp: 30 capsule, Rfl: 11   Melatonin 5 MG CHEW, Chew 10 mg by mouth at bedtime., Disp: , Rfl:    metroNIDAZOLE (METROCREAM) 0.75 % cream, APPLY TO AFFECTED AREA TWICE A DAY, Disp: , Rfl:    rosuvastatin  (CRESTOR ) 5 MG tablet, TAKE 1 TABLET BY MOUTH EVERY DAY IN THE EVENING, Disp: 90 tablet, Rfl: 3   venlafaxine  (EFFEXOR ) 100 MG tablet, 2 po q am, 1 qhs, Disp: 270 tablet, Rfl: 3   b complex vitamins tablet, Take 1 tablet by mouth daily. (Patient not taking: Reported on 06/29/2023), Disp: 30 tablet, Rfl: 11   LORazepam  (ATIVAN ) 0.5 MG tablet, Take 0.5-1 tablets (0.25-0.5 mg total) by mouth every 8 (eight) hours as needed for anxiety., Disp: 30 tablet, Rfl: 5   nitroGLYCERIN  (NITROSTAT ) 0.4 MG SL tablet, Place 1 tablet (0.4 mg total) under the tongue every 5 (five) minutes as needed for chest pain (up to 3 doses). (Patient not taking: Reported on 06/29/2023), Disp: 25 tablet, Rfl: 6 Medication Side Effects: none  Family Medical/ Social History: Changes? No  MENTAL HEALTH EXAM:  There were  no vitals taken for this visit.There is no height or weight on file to calculate BMI.  General Appearance: Casual, Neat and Well Groomed  Eye Contact:  Good  Speech:  Clear and Coherent and Normal Rate  Volume:  Normal  Mood:  Euthymic  Affect:  Congruent  Thought Process:  Goal Directed and Descriptions of Associations: Circumstantial  Orientation:  Full (Time, Place, and Person)  Thought Content: Logical   Suicidal Thoughts:  No  Homicidal Thoughts:  No   Memory:  WNL  Judgement:  Good  Insight:  Good  Psychomotor Activity:  Normal  Concentration:  Concentration: Good and Attention Span: Good  Recall:  Good  Fund of Knowledge: Good  Language: Good  Assets:  Desire for Improvement Financial Resources/Insurance Housing Resilience Transportation Vocational/Educational  ADL's:  Intact  Cognition: WNL  Prognosis:  Good   DIAGNOSES:    ICD-10-CM   1. Generalized anxiety disorder  F41.1     2. 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 Obstructive sleep apnea  G47.33       Receiving Psychotherapy:  Yes with Reid Capuchin, LCSW  RECOMMENDATIONS: PDMP was reviewed.  Testosterone filled 10/30/2022.  Last Ativan  prescription filled 11/10/2022. I provided 20 minutes of face to face time during this encounter, including time spent before and after the visit in records review, medical decision making, counseling pertinent to today's visit, and charting.   She is doing well as far as her medications go so no changes will be made.  Sleep hygiene discussed.  Recommend CPAP use.  Continue Wellbutrin  XL 300 mg, 1 p.o. every morning. Continue Buspar  15 mg, 1 po bid. Continue Ativan  0.5 mg, 1-2 every 8 hours as needed anxiety.  (Okay to take up to 4 before she has blood drawn as long as she has a driver.)  Continue Effexor  100 mg, 2 p.o. every morning and 1 p.o. nightly. Continue multivitamin, omega red, vitamin D, and B complex daily. Continue therapy with Reid Capuchin, LCSW.  Return in 4 months.   Marvia Slocumb, PA-C

## 2023-06-29 NOTE — Progress Notes (Signed)
 [facility] Counselor/Therapist Progress Note  Patient ID: Shlanda Chojnacki, MRN: [id number],    Date: 06/29/2023  Time Spent: 55 minutes   Treatment Type: Individual Therapy  Reported Symptoms: anxiety, second-guesses herself,overthinking   Mental Status Exam:  Appearance:   Well Groomed     Behavior:  Appropriate, Sharing, and Motivated  Motor:  Normal  Speech/Language:   Clear and Coherent  Affect:  anxious  Mood:  anxious  Thought process:  goal directed  Thought content:    Rumination  Sensory/Perceptual disturbances:    WNL  Orientation:  oriented to person, place, time/date, situation, day of week, month of year, year, and stated date of Jun 29, 2023  Attention:  Good  Concentration:  Good  Memory:  WNL  Fund of knowledge:   Good  Insight:    Good and Fair  Judgment:   Good  Impulse Control:  Fair   Risk Assessment: Danger to Self:  No Self-injurious Behavior: No Danger to Others: No Duty to Warn:no Physical Aggression / Violence:No  Access to Firearms a concern: No  Gang Involvement:No   Subjective:   Patient in session today reporting anxiety and the impact of it on her grandson and family. Very difficult for her to follow through on strategies regularly re: anxiety and did focus more on this today. Worked on some anxiety strategies and some interpersonal strategies related to current family stressors.  Encouraged patient to be more committed to her goals and really working on them outside of sessions as much is inside of sessions which she did work well in session today.  Still having some self judging, lots of worrying about the family and herself and grandchildren.  Some interpersonal issues due to her daughters relationship with another gentleman who also has his children and that can get complicated for patient at times.  Encouraged her to work further on her self-confidence and insecurity issues as discussed in session today.  Also encouraged more positive self  talk and less self judging and we processed several examples of this.  Interventions: Cognitive Behavioral Therapy and Ego-Supportive Long term goal: Reduce overall level, frequency, and intensity of anxiety so that daily functioning is not impaired. Short term goal: Increase understanding of beliefs and messages that produce worry and anxiety. Strategy: 1.Explore cognitive messages that generate anxiety and retrain in adaptive cognitions. 2.Develop behavioral and cognitive strategies to reduce or eliminate irrational anxiety.  Diagnosis:   ICD-10-CM   1. Generalized anxiety disorder  F41.1      Plan:  Patient today in session working further on her symptoms of stress management, family issues, anxiety, regretting some of her behavior patterns in the past, and self negating.  Showing a little more motivation once she got into the hard of the session and really encouraged her to follow through with suggested strategies when she is at home and within family environment.  Encouraged not to make quick assumptions regarding certain issues within the family.  Boundary issues discussed. Reminded and encouraged patient to be practicing more positive/self affirming behaviors as noted in sessions including: Believe more in herself and her ability to make changes in her thinking and behavior that will be positive for her, recognize and let go of negative/destructive thought patterns from the past that interrupts her moving forward, stay in the present focusing on what she can control or change, remain in touch with people who are supportive, remain on her prescribed medications, be open to looking at issues and other  ways that might be more helpful for her and provide some healing, refrain from assuming worst-case scenarios, reduce overthinking and over analyzing, consider that sometimes she does not fully understand what someone else is saying or maybe took it the wrong way and be able to check it out rather  than assuming negatives, challenge her self-doubt, saying no without feeling guilty, continue her work on control issues, and recognize the strengths she shows working with goal-directed behaviors to move in a direction that supports her overall improved emotional health and overall wellbeing.  Hardeep Hopping has made progress and needs to continue her work with goal-directed behaviors to keep her moving in a more positive direction.  Goal review and progress/challenges noted with patient.  Next appointment within 6 weeks.   Kelleen Patee, LCSW

## 2023-06-30 NOTE — Telephone Encounter (Signed)
I called patient and scheduled surgery. 

## 2023-07-23 ENCOUNTER — Telehealth: Payer: Self-pay | Admitting: Physician Assistant

## 2023-07-23 NOTE — Telephone Encounter (Signed)
 Pt Lvm @ 9:45a.  She said she was prescribed Trazadone 100mg .  She tried taking 1/2 pill and it made her feel groggy and constipated.  But she has been having trouble getting to sleep, so she took 1/4 tablet starting this pas Sat and it helped her.  She is asking if Ammon Bales will call in a refill to    [id number] [place], [place] - [address] AT [facility] [address] [place] [address] Phone: [phone]  Fax: [phone]   Next appt 9/5

## 2023-07-24 ENCOUNTER — Other Ambulatory Visit: Payer: Self-pay

## 2023-07-24 MED ORDER — TRAZODONE HCL 50 MG PO TABS: 25.0000 mg | ORAL_TABLET | Freq: Every day | ORAL | 0 refills | Status: DC

## 2023-07-24 NOTE — Telephone Encounter (Signed)
 Pended Rx for trazodone  50 mg, 1/2 tablet. Sent to [place] for her review.

## 2023-08-03 NOTE — Pre-Procedure Instructions (Signed)
 Surgical Instructions   Your procedure is scheduled on Tuesday, June 24th. Report to [facility] Main Entrance "A" at 05:30 A.M., then check in with the Admitting office. Any questions or running late day of surgery: call [phone]  Questions prior to your surgery date: call [phone], Monday-Friday, 8am-4pm. If you experience any cold or flu symptoms such as cough, fever, chills, shortness of breath, etc. between now and your scheduled surgery, please notify us  at the above number.     Remember:  Do not eat after midnight the night before your surgery  You may drink clear liquids until 04:30 AM the morning of your surgery.   Clear liquids allowed are: Water, Non-Citrus Juices (without pulp), Carbonated Beverages, Clear Tea (no milk, honey, etc.), Black Coffee Only (NO MILK, CREAM OR POWDERED CREAMER of any kind), and Gatorade.  Patient Instructions  The night before surgery:  No food after midnight. ONLY clear liquids after midnight  The day of surgery (if you do NOT have diabetes):  Drink ONE (1) Pre-Surgery Clear Ensure by 04:30 AM the morning of surgery. Drink in one sitting. Do not sip.  This drink was given to you during your hospital  pre-op appointment visit.  Nothing else to drink after completing the  Pre-Surgery Clear Ensure.          If you have questions, please contact your surgeon's office.    Take these medicines the morning of surgery with A SIP OF WATER  buPROPion  (WELLBUTRIN  XL)  busPIRone  (BUSPAR )  carvedilol  (COREG )  venlafaxine  (EFFEXOR )    May take these medicines IF NEEDED: acetaminophen  (TYLENOL )  doxycycline  (ORACEA )  LORazepam  (ATIVAN )  nitroGLYCERIN  (NITROSTAT )- If you have to take this medication prior to surgery, please call [phone] and report this to a nurse    Follow your surgeon's instructions on when to stop Aspirin .  If no instructions were given by your surgeon then you will need to call the office to get those instructions.     One week prior to surgery, STOP taking any Aleve, Naproxen, Ibuprofen, Motrin, Advil, Goody's, BC's, all herbal medications, fish oil, and non-prescription vitamins.                     Do NOT Smoke (Tobacco/Vaping) for 24 hours prior to your procedure.  If you use a CPAP at night, you may bring your mask/headgear for your overnight stay.   You will be asked to remove any contacts, glasses, piercing's, hearing aid's, dentures/partials prior to surgery. Please bring cases for these items if needed.    Patients discharged the day of surgery will not be allowed to drive home, and someone needs to stay with them for 24 hours.  SURGICAL WAITING ROOM VISITATION Patients may have no more than 2 support people in the waiting area - these visitors may rotate.   Pre-op nurse will coordinate an appropriate time for 1 ADULT support person, who may not rotate, to accompany patient in pre-op.  Children under the age of 61 must have an adult with them who is not the patient and must remain in the main waiting area with an adult.  If the patient needs to stay at the hospital during part of their recovery, the visitor guidelines for inpatient rooms apply.  Please refer to the [facility] website for the visitor guidelines for any additional information.   If you received a COVID test during your pre-op visit  it is requested that you wear a mask when out  in public, stay away from anyone that may not be feeling well and notify your surgeon if you develop symptoms. If you have been in contact with anyone that has tested positive in the last 10 days please notify you surgeon.      Pre-operative 5 CHG Bathing Instructions   You can play a key role in reducing the risk of infection after surgery. Your skin needs to be as free of germs as possible. You can reduce the number of germs on your skin by washing with CHG (chlorhexidine gluconate) soap before surgery. CHG is an antiseptic soap that kills germs  and continues to kill germs even after washing.   DO NOT use if you have an allergy to chlorhexidine/CHG or antibacterial soaps. If your skin becomes reddened or irritated, stop using the CHG and notify one of our RNs at [phone].   Please shower with the CHG soap starting 4 days before surgery using the following schedule:     Please keep in mind the following:  DO NOT shave, including legs and underarms, starting the day of your first shower.   You may shave your face at any point before/day of surgery.  Place clean sheets on your bed the day you start using CHG soap. Use a clean washcloth (not used since being washed) for each shower. DO NOT sleep with pets once you start using the CHG.   CHG Shower Instructions:  Wash your face and private area with normal soap. If you choose to wash your hair, wash first with your normal shampoo.  After you use shampoo/soap, rinse your hair and body thoroughly to remove shampoo/soap residue.  Turn the water OFF and apply about 3 tablespoons (45 ml) of CHG soap to a CLEAN washcloth.  Apply CHG soap ONLY FROM YOUR NECK DOWN TO YOUR TOES (washing for 3-5 minutes)  DO NOT use CHG soap on face, private areas, open wounds, or sores.  Pay special attention to the area where your surgery is being performed.  If you are having back surgery, having someone wash your back for you may be helpful. Wait 2 minutes after CHG soap is applied, then you may rinse off the CHG soap.  Pat dry with a clean towel  Put on clean clothes/pajamas   If you choose to wear lotion, please use ONLY the CHG-compatible lotions that are listed below.  Additional instructions for the day of surgery: DO NOT APPLY any lotions, deodorants, cologne, or perfumes.   Do not bring valuables to the hospital. [facility] is not responsible for any belongings/valuables. Do not wear nail polish, gel polish, artificial nails, or any other type of covering on natural nails (fingers and  toes) Do not wear jewelry or makeup Put on clean/comfortable clothes.  Please brush your teeth.  Ask your nurse before applying any prescription medications to the skin.     CHG Compatible Lotions   Aveeno Moisturizing lotion  Cetaphil Moisturizing Cream  Cetaphil Moisturizing Lotion  Clairol Herbal Essence Moisturizing Lotion, Dry Skin  Clairol Herbal Essence Moisturizing Lotion, Extra Dry Skin  Clairol Herbal Essence Moisturizing Lotion, Normal Skin  Curel Age Defying Therapeutic Moisturizing Lotion with Alpha Hydroxy  Curel Extreme Care Body Lotion  Curel Soothing Hands Moisturizing Hand Lotion  Curel Therapeutic Moisturizing Cream, Fragrance-Free  Curel Therapeutic Moisturizing Lotion, Fragrance-Free  Curel Therapeutic Moisturizing Lotion, Original Formula  Eucerin Daily Replenishing Lotion  Eucerin Dry Skin Therapy Plus Alpha Hydroxy Crme  Eucerin Dry Skin Therapy Plus Alpha Hydroxy  Lotion  Eucerin Original Crme  Eucerin Original Lotion  Eucerin Plus Crme Eucerin Plus Lotion  Eucerin TriLipid Replenishing Lotion  Keri Anti-Bacterial Hand Lotion  Keri Deep Conditioning Original Lotion Dry Skin Formula Softly Scented  Keri Deep Conditioning Original Lotion, Fragrance Free Sensitive Skin Formula  Keri Lotion Fast Absorbing Fragrance Free Sensitive Skin Formula  Keri Lotion Fast Absorbing Softly Scented Dry Skin Formula  Keri Original Lotion  Keri Skin Renewal Lotion Keri Silky Smooth Lotion  Keri Silky Smooth Sensitive Skin Lotion  Nivea Body Creamy Conditioning Oil  Nivea Body Extra Enriched Lotion  Nivea Body Original Lotion  Nivea Body Sheer Moisturizing Lotion Nivea Crme  Nivea Skin Firming Lotion  NutraDerm 30 Skin Lotion  NutraDerm Skin Lotion  NutraDerm Therapeutic Skin Cream  NutraDerm Therapeutic Skin Lotion  ProShield Protective Hand Cream  Provon moisturizing lotion  Please read over the following fact sheets that you were given.

## 2023-08-04 ENCOUNTER — Other Ambulatory Visit: Payer: Self-pay

## 2023-08-04 ENCOUNTER — Encounter (HOSPITAL_COMMUNITY): Payer: Self-pay | Admitting: *Deleted

## 2023-08-04 ENCOUNTER — Telehealth: Payer: Self-pay | Admitting: Orthopaedic Surgery

## 2023-08-04 ENCOUNTER — Encounter (HOSPITAL_COMMUNITY)
Admission: RE | Admit: 2023-08-04 | Discharge: 2023-08-04 | Disposition: A | Discharge status: Home / Self Care | Source: Ambulatory Visit | Attending: Orthopaedic Surgery | Admitting: Orthopaedic Surgery

## 2023-08-04 VITALS — BP 156/94 | HR 90 | Temp 98.2°F | Resp 17 | Ht 62.0 in | Wt 140.3 lb

## 2023-08-04 DIAGNOSIS — M1612 Unilateral primary osteoarthritis, left hip: Secondary | ICD-10-CM | POA: Insufficient documentation

## 2023-08-04 DIAGNOSIS — I252 Old myocardial infarction: Secondary | ICD-10-CM | POA: Insufficient documentation

## 2023-08-04 DIAGNOSIS — F419 Anxiety disorder, unspecified: Secondary | ICD-10-CM | POA: Insufficient documentation

## 2023-08-04 DIAGNOSIS — I10 Essential (primary) hypertension: Secondary | ICD-10-CM | POA: Insufficient documentation

## 2023-08-04 DIAGNOSIS — I2510 Atherosclerotic heart disease of native coronary artery without angina pectoris: Secondary | ICD-10-CM | POA: Diagnosis not present

## 2023-08-04 DIAGNOSIS — Z01818 Encounter for other preprocedural examination: Secondary | ICD-10-CM | POA: Insufficient documentation

## 2023-08-04 DIAGNOSIS — I5181 Takotsubo syndrome: Secondary | ICD-10-CM | POA: Insufficient documentation

## 2023-08-04 DIAGNOSIS — G4733 Obstructive sleep apnea (adult) (pediatric): Secondary | ICD-10-CM | POA: Insufficient documentation

## 2023-08-04 HISTORY — DX: Essential (primary) hypertension: I10

## 2023-08-04 HISTORY — DX: Unilateral primary osteoarthritis, left hip: M16.12

## 2023-08-04 LAB — SURGICAL PCR SCREEN
MRSA, PCR: NEGATIVE
Staphylococcus aureus: NEGATIVE

## 2023-08-04 LAB — CBC
HCT: 39.1 % (ref 36.0–46.0)
Hemoglobin: 12.9 g/dL (ref 12.0–15.0)
MCH: 29.8 pg (ref 26.0–34.0)
MCHC: 33 g/dL (ref 30.0–36.0)
MCV: 90.3 fL (ref 80.0–100.0)
Platelets: 323 10*3/uL (ref 150–400)
RBC: 4.33 MIL/uL (ref 3.87–5.11)
RDW: 12.4 % (ref 11.5–15.5)
WBC: 5 10*3/uL (ref 4.0–10.5)
nRBC: 0 % (ref 0.0–0.2)

## 2023-08-04 LAB — BASIC METABOLIC PANEL WITH GFR
Anion gap: 11 (ref 5–15)
BUN: 16 mg/dL (ref 6–20)
CO2: 26 mmol/L (ref 22–32)
Calcium: 9.6 mg/dL (ref 8.9–10.3)
Chloride: 102 mmol/L (ref 98–111)
Creatinine, Ser: 0.77 mg/dL (ref 0.44–1.00)
GFR, Estimated: >60 mL/min (ref >60)
Glucose, Bld: 84 mg/dL (ref 70–99)
Potassium: 4.4 mmol/L (ref 3.5–5.1)
Sodium: 139 mmol/L (ref 135–145)

## 2023-08-04 LAB — TYPE AND SCREEN
ABO/RH(D): O POS
Antibody Screen: NEGATIVE

## 2023-08-04 NOTE — Telephone Encounter (Signed)
 Patient aware 5 days prior

## 2023-08-04 NOTE — Progress Notes (Signed)
 PCP - Aloha Arnold, PA-C Cardiologist - Dr. Ahmad Alert  PPM/ICD - denies   Chest x-ray - 01/18/18 EKG - 03/10/23 Stress Test - denies ECHO - 01/19/18 Cardiac Cath - 01/19/18  Sleep Study - OSA+ CPAP - nightly, pt unsure of pressure settings  DM- denies  Last dose of GLP1 agonist-  n/a   Blood Thinner Instructions: n/a Aspirin  Instructions: f/u with surgeon  ERAS Protcol - clears until 0430 PRE-SURGERY Ensure given  COVID TEST- n/a   Anesthesia review: yes, cardiac hx  Patient denies shortness of breath, fever, cough and chest pain at PAT appointment   All instructions explained to the patient, with a verbal understanding of the material. Patient agrees to go over the instructions while at home for a better understanding.  The opportunity to ask questions was provided.

## 2023-08-04 NOTE — Telephone Encounter (Signed)
 Pt is requesting a call in regards to getting a answer whether or not she should stop or continue taking Aspirin  before surgery.

## 2023-08-05 NOTE — Progress Notes (Signed)
 Anesthesia Chart Review:  Case: [id number] Date/Time: 08/18/23 0715   Procedure: ARTHROPLASTY, HIP, TOTAL, ANTERIOR APPROACH (Left: Hip)   Anesthesia type: Spinal   Diagnosis: Primary osteoarthritis of left hip [M16.12]   Pre-op diagnosis: left hip osteoarthritis   [facility]: [facility] OR ROOM 02 / [facility] OR   Surgeons: Arnie Lao, MD       DISCUSSION: Patient is a 58 year old female scheduled for the above procedure.  History includes never smoker, Takotsubo cardiomypathy (NSTEMI with apical ballooning, normal EF mild CAD 12/2017), HTN, OSA (uses CPAP), anxiety, osteoarthritis, hematochezia (2022; moderate sigmoid diverticulosis, non-bleeding internal hemorrhoids 02/11/21 colonoscopy).  She had cardiology visit with Dr. Alroy Aspen on 03/10/23. She was transferred to [facility] on 01/19/18 after evaluation for chest pain and ruled in for NSTEMI (initial troponin 0.3->1.1). She underwent LHC that showed apical ballooning suggestive of Takotsubo cardiomyopathy with preserved EF (55-60%), 40% RPDA with otherwise normal coronaries mild CAD. Most cath she had a TTE with LVEF 60-65%, no regional wall motion abnormalities, trivia AI. She was discharged on b-blocker and ARB. She has followed with Dr. Alroy Aspen routinely ever since. At her 03/10/23 visit she was doing very well with no recurrent Takotsubo syndrome symptoms. Last LDL 70. Continue current therapy with 1 year follow-up advised. She denied chest pain or SOB at PAT RN visit.   Ortho advised hold ASA 5 days prior to surgery.  Anesthesia team to evaluate on the day of surgery.    VS: BP (!) 156/94   Pulse 90   Temp 36.8 C   Resp 17   Ht 5' 2 (1.575 m)   Wt 63.6 kg   SpO2 98%   BMI 25.66 kg/m   PROVIDERS: Aloha Arnold, PA-C is PCP  Ahmad Alert, MD is cardiologist  Lajuan Pila, MD is GI   LABS: Labs reviewed: Acceptable for surgery. (all labs ordered are listed, but only abnormal results are displayed)  Labs  Reviewed  SURGICAL PCR SCREEN  CBC  BASIC METABOLIC PANEL WITH GFR  TYPE AND SCREEN     IMAGES: Xray left hip 05/06/23 [facility]): Severe left hip osteoarthrosis. No acute osseous abnormality.    EKG: EKG 03/10/23: Normal sinus rhythm Normal ECG When compared with ECG of 19-Jan-2018 04:51, QRS axis Shifted right Nonspecific T wave abnormality no longer evident in Lateral leads Confirmed by Ahmad Alert [phone]) on 03/10/2023 12:27:34 PM   CV: Echo 01/19/18: Study Conclusions  - Left ventricle: The cavity size was normal. Systolic function was    normal. The estimated ejection fraction was in the range of 60%    to 65%. Wall motion was normal; there were no regional wall    motion abnormalities. Left ventricular diastolic function    parameters were normal.  - Aortic valve: There was trivial regurgitation. Valve area (VTI):    1.76 cm^2. Valve area (Vmax): 1.68 cm^2. Valve area (Vmean): 1.84    cm^2.   Cardiac cath 01/19/18: RPDA lesion is 40% stenosed. Otherwise angiographically normal coronary arteries The left ventricular ejection fraction is 55-65% by visual estimate. LV end diastolic pressure is moderately elevated. Apical ballooning with preserved EF.   SUMMARY Angiographically mild RPDA disease otherwise normal coronaries but tortuous. Left ventriculography suggestive of Takotsubo cardiomyopathy with preserved EF, apical ballooning Moderately elevated LVEDP   RECOMMENDATION -medical management TR band removal per protocol Anticipate discharge tomorrow No indication for antiplatelet therapy at this time.3    Past Medical History:  Diagnosis Date   Acne  Anxiety    Depression    Hematochezia    Hypertension    Osteoarthritis of left hip    Sleep apnea    uses CPAP   Takotsubo cardiomyopathy    a. NSTEMI 12/2017 -> cath with normal coronaries, preserved EF but apical ballooning suggestive of Takotsubo cardiomyopathy.    Past Surgical History:   Procedure Laterality Date   COLONOSCOPY  09/21/2014   Moderate predominantly sigmoid diverticulosis. Otherwise normal colonoscopy   CYSTECTOMY  2018   LEFT HEART CATH AND CORONARY ANGIOGRAPHY N/A 01/19/2018   Procedure: LEFT HEART CATH AND CORONARY ANGIOGRAPHY;  Surgeon: Arleen Lacer, MD;  Location: [facility] INVASIVE CV LAB;  Service: Cardiovascular;  Laterality: N/A;    MEDICATIONS:  bisacodyl ([facility] C-LAX LAXATIVE) 5 MG EC tablet   diphenhydrAMINE (BENADRYL) 25 mg capsule   docusate sodium (COLACE) 250 MG capsule   acetaminophen  (TYLENOL ) 325 MG tablet   aspirin  81 MG EC tablet   buPROPion  (WELLBUTRIN  XL) 300 MG 24 hr tablet   busPIRone  (BUSPAR ) 15 MG tablet   carvedilol  (COREG ) 3.125 MG tablet   diphenhydramine-acetaminophen  (TYLENOL  PM) 25-500 MG TABS tablet   doxycycline  (ORACEA ) 40 MG capsule   ibuprofen (ADVIL) 200 MG tablet   LORazepam  (ATIVAN ) 0.5 MG tablet   losartan  (COZAAR ) 25 MG tablet   MegaRed Omega-3 Krill Oil 500 MG CAPS   Melatonin 10 MG TABS   metroNIDAZOLE (METROGEL) 1 % gel   naproxen sodium (ALEVE) 220 MG tablet   nitroGLYCERIN  (NITROSTAT ) 0.4 MG SL tablet   OVER THE COUNTER MEDICATION   rosuvastatin  (CRESTOR ) 5 MG tablet   traZODone  (DESYREL ) 50 MG tablet   venlafaxine  (EFFEXOR ) 100 MG tablet   No current facility-administered medications for this encounter.    Ella Gun, PA-C Surgical Short Stay/Anesthesiology [facility] Phone [phone] [facility] Phone [phone] 08/06/2023 3:51 PM

## 2023-08-06 ENCOUNTER — Encounter (HOSPITAL_COMMUNITY): Payer: Self-pay | Admitting: Orthopaedic Surgery

## 2023-08-06 NOTE — Anesthesia Preprocedure Evaluation (Addendum)
 Anesthesia Evaluation  Patient identified by MRN, date of birth, ID band Patient awake    Reviewed: Allergy & Precautions, NPO status , Patient's Chart, lab work & pertinent test results  Airway Mallampati: III  TM Distance: >3 FB Neck ROM: Full    Dental  (+) Teeth Intact, Dental Advisory Given   Pulmonary sleep apnea and Continuous Positive Airway Pressure Ventilation    Pulmonary exam normal breath sounds clear to auscultation       Cardiovascular hypertension, Pt. on home beta blockers and Pt. on medications (-) angina + CAD and + Past MI  (-) Cardiac Stents Normal cardiovascular exam Rhythm:Regular Rate:Normal  H/o Takotsubo cardiomyopathy  Echo 01/19/18: Study Conclusions   - Left ventricle: The cavity size was normal. Systolic function was    normal. The estimated ejection fraction was in the range of 60%    to 65%. Wall motion was normal; there were no regional wall    motion abnormalities. Left ventricular diastolic function    parameters were normal.  - Aortic valve: There was trivial regurgitation. Valve area (VTI):    1.76 cm^2. Valve area (Vmax): 1.68 cm^2. Valve area (Vmean): 1.84    cm^2.     Neuro/Psych  PSYCHIATRIC DISORDERS Anxiety Depression    negative neurological ROS     GI/Hepatic negative GI ROS, Neg liver ROS,,,  Endo/Other  negative endocrine ROS    Renal/GU negative Renal ROS     Musculoskeletal  (+) Arthritis ,    Abdominal   Peds  Hematology negative hematology ROS (+) Plt 323k   Anesthesia Other Findings   Reproductive/Obstetrics                             Anesthesia Physical Anesthesia Plan  ASA: 3  Anesthesia Plan: Spinal   Post-op Pain Management: Tylenol  PO (pre-op)* and Toradol IV (intra-op)*   Induction:   PONV Risk Score and Plan: 2 and Midazolam , TIVA, Dexamethasone and Ondansetron   Airway Management Planned: Simple Face Mask and  Natural Airway  Additional Equipment:   Intra-op Plan:   Post-operative Plan:   Informed Consent: I have reviewed the patients History and Physical, chart, labs and discussed the procedure including the risks, benefits and alternatives for the proposed anesthesia with the patient or authorized representative who has indicated his/her understanding and acceptance.     Dental advisory given  Plan Discussed with: CRNA  Anesthesia Plan Comments: (PAT note written 08/06/2023 by Allison Zelenak, PA-C.  )       Anesthesia Quick Evaluation

## 2023-08-10 ENCOUNTER — Encounter: Payer: Self-pay | Admitting: Orthopaedic Surgery

## 2023-08-17 NOTE — H&P (Signed)
 TOTAL HIP ADMISSION H&P  Patient is admitted for left total hip arthroplasty.  Subjective:  Chief Complaint: left hip pain  HPI: Alexis Ibarra, 58 y.o. female, has a history of pain and functional disability in the left hip(s) due to arthritis and patient has failed non-surgical conservative treatments for greater than 12 weeks to include NSAID's and/or analgesics and activity modification.  Onset of symptoms was abrupt starting just several months ago with rapidlly worsening course since that time.The patient noted no past surgery on the left hip(s).  Patient currently rates pain in the left hip at 10 out of 10 with activity. Patient has night pain, worsening of pain with activity and weight bearing, trendelenberg gait, pain that interfers with activities of daily living, and pain with passive range of motion. Patient has evidence of subchondral sclerosis, periarticular osteophytes, and joint space narrowing by imaging studies. This condition presents safety issues increasing the risk of falls.  There is no current active infection.  Patient Active Problem List   Diagnosis Date Noted   Unilateral primary osteoarthritis, left hip 06/01/2023   Pain in thoracic spine 09/16/2021   Pain in right hip 08/02/2019   Takotsubo cardiomyopathy 01/20/2018   Anxiety 01/20/2018   Depression 01/20/2018   Acne 01/20/2018   Elevated troponin 01/19/2018   Past Medical History:  Diagnosis Date   Acne    Anxiety    Depression    Hematochezia    Hypertension    Osteoarthritis of left hip    Sleep apnea    uses CPAP   Takotsubo cardiomyopathy    a. NSTEMI 12/2017 -> cath with normal coronaries, preserved EF but apical ballooning suggestive of Takotsubo cardiomyopathy.    Past Surgical History:  Procedure Laterality Date   COLONOSCOPY  09/21/2014   Moderate predominantly sigmoid diverticulosis. Otherwise normal colonoscopy   CYSTECTOMY  2018   LEFT HEART CATH AND CORONARY ANGIOGRAPHY N/A 01/19/2018    Procedure: LEFT HEART CATH AND CORONARY ANGIOGRAPHY;  Surgeon: Anner Alm ORN, MD;  Location: [facility] INVASIVE CV LAB;  Service: Cardiovascular;  Laterality: N/A;    No current facility-administered medications for this encounter.   Current Outpatient Medications  Medication Sig Dispense Refill Last Dose/Taking   acetaminophen  (TYLENOL ) 325 MG tablet Take 650 mg by mouth every 6 (six) hours as needed for moderate pain (pain score 4-6).   Taking As Needed   aspirin  81 MG EC tablet Take 1 tablet (81 mg total) by mouth daily. 30 tablet 6 Taking   buPROPion  (WELLBUTRIN  XL) 300 MG 24 hr tablet Take 1 tablet (300 mg total) by mouth daily. 90 tablet 3 Taking   busPIRone  (BUSPAR ) 15 MG tablet Take 1 tablet (15 mg total) by mouth 3 (three) times daily. (Patient taking differently: Take 15 mg by mouth 2 (two) times daily.) 270 tablet 3 Taking Differently   carvedilol  (COREG ) 3.125 MG tablet TAKE 1 TABLET(3.125 MG) BY MOUTH TWICE DAILY WITH A MEAL. 180 tablet 3 Taking   diphenhydramine-acetaminophen  (TYLENOL  PM) 25-500 MG TABS tablet Take 2 tablets by mouth at bedtime as needed (sleep/pain).   Taking As Needed   doxycycline  (ORACEA ) 40 MG capsule Take 40 mg by mouth daily as needed (rosacea).   Taking As Needed   ibuprofen (ADVIL) 200 MG tablet Take 400-800 mg by mouth every 6 (six) hours as needed for moderate pain (pain score 4-6).   Taking As Needed   LORazepam  (ATIVAN ) 0.5 MG tablet Take 0.5-1 tablets (0.25-0.5 mg total) by mouth every 8 (eight)  hours as needed for anxiety. 30 tablet 5 Taking As Needed   losartan  (COZAAR ) 25 MG tablet TAKE 1/2 TABLET BY MOUTH DAILY 45 tablet 3 Taking   MegaRed Omega-3 Krill Oil 500 MG CAPS Take 500 mg by mouth daily. 30 capsule 11 Taking   Melatonin 10 MG TABS Take 10 mg by mouth at bedtime as needed (sleep).   Taking As Needed   metroNIDAZOLE (METROGEL) 1 % gel Apply 1 Application topically daily.   Taking   naproxen sodium (ALEVE) 220 MG tablet Take 220 mg by mouth daily  as needed (pain).   Taking As Needed   nitroGLYCERIN  (NITROSTAT ) 0.4 MG SL tablet Place 1 tablet (0.4 mg total) under the tongue every 5 (five) minutes as needed for chest pain (up to 3 doses). 25 tablet 6 Taking As Needed   OVER THE COUNTER MEDICATION Take 2 capsules by mouth daily. Provitalize Supplement   Taking   rosuvastatin  (CRESTOR ) 5 MG tablet TAKE 1 TABLET BY MOUTH EVERY DAY IN THE EVENING 90 tablet 3 Taking   traZODone  (DESYREL ) 50 MG tablet Take 0.5 tablets (25 mg total) by mouth at bedtime. (Patient taking differently: Take 25 mg by mouth at bedtime as needed for sleep.) 15 tablet 0 Taking Differently   venlafaxine  (EFFEXOR ) 100 MG tablet 2 po q am, 1 qhs 270 tablet 3 Taking   bisacodyl ([employer] C-LAX LAXATIVE) 5 MG EC tablet Take 5 mg by mouth daily as needed for moderate constipation.      diphenhydrAMINE (BENADRYL) 25 mg capsule Take 25 mg by mouth every 6 (six) hours as needed for sleep.      docusate sodium (COLACE) 250 MG capsule Take 250 mg by mouth daily.      No Known Allergies  Social History   Tobacco Use   Smoking status: Never   Smokeless tobacco: Never  Substance Use Topics   Alcohol use: Not Currently    Family History  Problem Relation Age of Onset   Hypothyroidism Mother    Colonic polyp Father    Hypertension Father    Kidney disease Father    Cirrhosis Father        non alcoholic   Colon polyps Father        precancerous   Liver cancer Father    Healthy Brother    Dementia Maternal Grandmother    CAD Maternal Grandfather    Dementia Paternal Grandmother    Heart failure Paternal Grandfather    Prostate cancer Paternal Grandfather    Depression Daughter    Colon cancer Neg Hx    Pancreatic cancer Neg Hx    Rectal cancer Neg Hx    Stomach cancer Neg Hx    Esophageal cancer Neg Hx      Review of Systems  Objective:  Physical Exam Vitals reviewed.  Constitutional:      Appearance: Normal appearance. She is normal weight.  HENT:     Head:  Normocephalic and atraumatic.   Eyes:     Extraocular Movements: Extraocular movements intact.     Pupils: Pupils are equal, round, and reactive to light.    Cardiovascular:     Rate and Rhythm: Normal rate and regular rhythm.  Pulmonary:     Effort: Pulmonary effort is normal.     Breath sounds: Normal breath sounds.  Abdominal:     Palpations: Abdomen is soft.   Musculoskeletal:     Cervical back: Normal range of motion and neck supple.  Left hip: Tenderness and bony tenderness present. Decreased range of motion. Decreased strength.   Neurological:     Mental Status: She is alert and oriented to person, place, and time.   Psychiatric:        Behavior: Behavior normal.     Vital signs in last 24 hours:    Labs:   Estimated body mass index is 25.66 kg/m as calculated from the following:   Height as of 08/04/23: 5' 2 (1.575 m).   Weight as of 08/04/23: 63.6 kg.   Imaging Review Plain radiographs demonstrate severe degenerative joint disease of the left hip(s). The bone quality appears to be excellent for age and reported activity level.      Assessment/Plan:  End stage arthritis, left hip(s)  The patient history, physical examination, clinical judgement of the provider and imaging studies are consistent with end stage degenerative joint disease of the left hip(s) and total hip arthroplasty is deemed medically necessary. The treatment options including medical management, injection therapy, arthroscopy and arthroplasty were discussed at length. The risks and benefits of total hip arthroplasty were presented and reviewed. The risks due to aseptic loosening, infection, stiffness, dislocation/subluxation,  thromboembolic complications and other imponderables were discussed.  The patient acknowledged the explanation, agreed to proceed with the plan and consent was signed. Patient is being admitted for inpatient treatment for surgery, pain control, PT, OT, prophylactic  antibiotics, VTE prophylaxis, progressive ambulation and ADL's and discharge planning.The patient is planning to be discharged home with home health services

## 2023-08-18 ENCOUNTER — Encounter (HOSPITAL_COMMUNITY)
Admission: RE | Disposition: A | Discharge status: Home / Self Care | Payer: Self-pay | Source: Home / Self Care | Attending: Orthopaedic Surgery

## 2023-08-18 ENCOUNTER — Ambulatory Visit (HOSPITAL_COMMUNITY): Payer: Self-pay | Admitting: Vascular Surgery

## 2023-08-18 ENCOUNTER — Encounter (HOSPITAL_COMMUNITY): Payer: Self-pay | Admitting: Orthopaedic Surgery

## 2023-08-18 ENCOUNTER — Ambulatory Visit (HOSPITAL_COMMUNITY)
Admission: RE | Admit: 2023-08-18 | Discharge: 2023-08-19 | Disposition: A | Discharge status: Home / Self Care | Attending: Orthopaedic Surgery | Admitting: Orthopaedic Surgery

## 2023-08-18 ENCOUNTER — Ambulatory Visit (HOSPITAL_COMMUNITY): Payer: Self-pay | Admitting: Anesthesiology

## 2023-08-18 ENCOUNTER — Other Ambulatory Visit: Payer: Self-pay

## 2023-08-18 ENCOUNTER — Other Ambulatory Visit: Payer: Self-pay | Admitting: Physician Assistant

## 2023-08-18 ENCOUNTER — Ambulatory Visit (HOSPITAL_COMMUNITY)

## 2023-08-18 ENCOUNTER — Observation Stay (HOSPITAL_COMMUNITY)

## 2023-08-18 DIAGNOSIS — I10 Essential (primary) hypertension: Secondary | ICD-10-CM | POA: Diagnosis not present

## 2023-08-18 DIAGNOSIS — Z7982 Long term (current) use of aspirin: Secondary | ICD-10-CM | POA: Insufficient documentation

## 2023-08-18 DIAGNOSIS — Z79899 Other long term (current) drug therapy: Secondary | ICD-10-CM | POA: Insufficient documentation

## 2023-08-18 DIAGNOSIS — I2510 Atherosclerotic heart disease of native coronary artery without angina pectoris: Secondary | ICD-10-CM

## 2023-08-18 DIAGNOSIS — M1612 Unilateral primary osteoarthritis, left hip: Secondary | ICD-10-CM | POA: Diagnosis not present

## 2023-08-18 DIAGNOSIS — G4733 Obstructive sleep apnea (adult) (pediatric): Secondary | ICD-10-CM

## 2023-08-18 DIAGNOSIS — Z96642 Presence of left artificial hip joint: Secondary | ICD-10-CM

## 2023-08-18 HISTORY — DX: Sleep apnea, unspecified: G47.30

## 2023-08-18 HISTORY — PX: TOTAL HIP ARTHROPLASTY: SHX124

## 2023-08-18 LAB — ABO/RH: ABO/RH(D): O POS

## 2023-08-18 SURGERY — ARTHROPLASTY, HIP, TOTAL, ANTERIOR APPROACH
Anesthesia: Spinal | Site: Hip | Laterality: Left

## 2023-08-18 MED ORDER — LOSARTAN POTASSIUM 25 MG PO TABS: 12.5000 mg | ORAL_TABLET | Freq: Every day | ORAL | Status: DC

## 2023-08-18 MED ORDER — ASPIRIN 81 MG PO CHEW
81.0000 mg | CHEWABLE_TABLET | Freq: Two times a day (BID) | ORAL | Status: DC
  Administered 2023-08-18: 81 mg via ORAL
  Filled 2023-08-18: qty 1

## 2023-08-18 MED ORDER — BUPROPION HCL ER (XL) 300 MG PO TB24: 300.0000 mg | ORAL_TABLET | Freq: Every day | ORAL | Status: DC

## 2023-08-18 MED ORDER — MIDAZOLAM HCL 2 MG/2ML IJ SOLN
INTRAMUSCULAR | Status: AC
  Filled 2023-08-18: qty 2

## 2023-08-18 MED ORDER — ROSUVASTATIN CALCIUM 5 MG PO TABS
5.0000 mg | ORAL_TABLET | Freq: Every day | ORAL | Status: DC
  Administered 2023-08-18: 5 mg via ORAL
  Filled 2023-08-18: qty 1

## 2023-08-18 MED ORDER — LORAZEPAM 0.5 MG PO TABS: 0.2500 mg | ORAL_TABLET | Freq: Three times a day (TID) | ORAL | Status: DC | PRN

## 2023-08-18 MED ORDER — CHLORHEXIDINE GLUCONATE 0.12 % MT SOLN
15.0000 mL | Freq: Once | OROMUCOSAL | Status: AC
  Administered 2023-08-18: 15 mL via OROMUCOSAL
  Filled 2023-08-18: qty 15

## 2023-08-18 MED ORDER — ONDANSETRON HCL 4 MG/2ML IJ SOLN
INTRAMUSCULAR | Status: DC | PRN
  Administered 2023-08-18: 4 mg via INTRAVENOUS

## 2023-08-18 MED ORDER — PROPOFOL 500 MG/50ML IV EMUL
INTRAVENOUS | Status: DC | PRN
  Administered 2023-08-18: 125 ug/kg/min via INTRAVENOUS

## 2023-08-18 MED ORDER — METOCLOPRAMIDE HCL 5 MG/ML IJ SOLN: 5.0000 mg | Freq: Three times a day (TID) | INTRAMUSCULAR | Status: DC | PRN

## 2023-08-18 MED ORDER — FENTANYL CITRATE (PF) 100 MCG/2ML IJ SOLN
25.0000 ug | INTRAMUSCULAR | Status: DC | PRN
  Administered 2023-08-18 (×2): 25 ug via INTRAVENOUS

## 2023-08-18 MED ORDER — 0.9 % SODIUM CHLORIDE (POUR BTL) OPTIME
TOPICAL | Status: DC | PRN
  Administered 2023-08-18: 1000 mL

## 2023-08-18 MED ORDER — LACTATED RINGERS IV SOLN: INTRAVENOUS | Status: DC

## 2023-08-18 MED ORDER — CEFAZOLIN SODIUM-DEXTROSE 2-4 GM/100ML-% IV SOLN
2.0000 g | Freq: Four times a day (QID) | INTRAVENOUS | Status: AC
  Administered 2023-08-18 (×2): 2 g via INTRAVENOUS
  Filled 2023-08-18 (×2): qty 100

## 2023-08-18 MED ORDER — ACETAMINOPHEN 500 MG PO TABS
1000.0000 mg | ORAL_TABLET | Freq: Once | ORAL | Status: AC
  Administered 2023-08-18: 1000 mg via ORAL
  Filled 2023-08-18: qty 2

## 2023-08-18 MED ORDER — DEXAMETHASONE SODIUM PHOSPHATE 10 MG/ML IJ SOLN
INTRAMUSCULAR | Status: DC | PRN
  Administered 2023-08-18: 10 mg via INTRAVENOUS

## 2023-08-18 MED ORDER — PHENOL 1.4 % MT LIQD: 1.0000 | OROMUCOSAL | Status: DC | PRN

## 2023-08-18 MED ORDER — METHOCARBAMOL 1000 MG/10ML IJ SOLN
500.0000 mg | Freq: Four times a day (QID) | INTRAMUSCULAR | Status: DC | PRN
Start: 2023-08-18 — End: 2023-08-19

## 2023-08-18 MED ORDER — CEFAZOLIN SODIUM-DEXTROSE 2-4 GM/100ML-% IV SOLN
2.0000 g | INTRAVENOUS | Status: AC
  Administered 2023-08-18: 2 g via INTRAVENOUS
  Filled 2023-08-18: qty 100

## 2023-08-18 MED ORDER — PROPOFOL 10 MG/ML IV BOLUS
INTRAVENOUS | Status: DC | PRN
  Administered 2023-08-18: 40 mg via INTRAVENOUS

## 2023-08-18 MED ORDER — HYDROMORPHONE HCL 1 MG/ML IJ SOLN
INTRAMUSCULAR | Status: AC
  Filled 2023-08-18: qty 1

## 2023-08-18 MED ORDER — SODIUM CHLORIDE 0.9 % IR SOLN
Status: DC | PRN
Start: 2023-08-18 — End: 2023-08-18
  Administered 2023-08-18: 1000 mL

## 2023-08-18 MED ORDER — FENTANYL CITRATE (PF) 100 MCG/2ML IJ SOLN
INTRAMUSCULAR | Status: DC | PRN
  Administered 2023-08-18: 50 ug via INTRAVENOUS

## 2023-08-18 MED ORDER — POVIDONE-IODINE 10 % EX SWAB
2.0000 | Freq: Once | CUTANEOUS | Status: AC
  Administered 2023-08-18: 2 via TOPICAL

## 2023-08-18 MED ORDER — ORAL CARE MOUTH RINSE: 15.0000 mL | Freq: Once | OROMUCOSAL | Status: AC

## 2023-08-18 MED ORDER — KETOROLAC TROMETHAMINE 15 MG/ML IJ SOLN
7.5000 mg | Freq: Four times a day (QID) | INTRAMUSCULAR | Status: AC
  Administered 2023-08-18 – 2023-08-19 (×2): 7.5 mg via INTRAVENOUS
  Filled 2023-08-18 (×2): qty 1

## 2023-08-18 MED ORDER — MIDAZOLAM HCL 2 MG/2ML IJ SOLN
INTRAMUSCULAR | Status: DC | PRN
  Administered 2023-08-18: 2 mg via INTRAVENOUS

## 2023-08-18 MED ORDER — ONDANSETRON HCL 4 MG/2ML IJ SOLN: 4.0000 mg | Freq: Four times a day (QID) | INTRAMUSCULAR | Status: DC | PRN

## 2023-08-18 MED ORDER — SODIUM CHLORIDE 0.9 % IV SOLN: INTRAVENOUS | Status: DC

## 2023-08-18 MED ORDER — VENLAFAXINE HCL 50 MG PO TABS
100.0000 mg | ORAL_TABLET | Freq: Two times a day (BID) | ORAL | Status: DC
  Administered 2023-08-18: 100 mg via ORAL
  Filled 2023-08-18 (×2): qty 2

## 2023-08-18 MED ORDER — DOCUSATE SODIUM 100 MG PO CAPS
100.0000 mg | ORAL_CAPSULE | Freq: Two times a day (BID) | ORAL | Status: DC
  Administered 2023-08-18 – 2023-08-19 (×2): 100 mg via ORAL
  Filled 2023-08-18 (×2): qty 1

## 2023-08-18 MED ORDER — FENTANYL CITRATE (PF) 100 MCG/2ML IJ SOLN
INTRAMUSCULAR | Status: AC
  Filled 2023-08-18: qty 2

## 2023-08-18 MED ORDER — HYDROMORPHONE HCL 1 MG/ML IJ SOLN
0.5000 mg | INTRAMUSCULAR | Status: DC | PRN
  Administered 2023-08-18: 1 mg via INTRAVENOUS

## 2023-08-18 MED ORDER — TRANEXAMIC ACID-NACL 1000-0.7 MG/100ML-% IV SOLN
1000.0000 mg | INTRAVENOUS | Status: AC
  Administered 2023-08-18: 1000 mg via INTRAVENOUS
  Filled 2023-08-18: qty 100

## 2023-08-18 MED ORDER — BUSPIRONE HCL 15 MG PO TABS
15.0000 mg | ORAL_TABLET | Freq: Two times a day (BID) | ORAL | Status: DC
Start: 2023-08-18 — End: 2023-08-19
  Administered 2023-08-18: 15 mg via ORAL
  Filled 2023-08-18: qty 1

## 2023-08-18 MED ORDER — BUPIVACAINE IN DEXTROSE 0.75-8.25 % IT SOLN
INTRATHECAL | Status: DC | PRN
Start: 2023-08-18 — End: 2023-08-18
  Administered 2023-08-18: 1.6 mL via INTRATHECAL

## 2023-08-18 MED ORDER — ALUM & MAG HYDROXIDE-SIMETH 200-200-20 MG/5ML PO SUSP: 30.0000 mL | ORAL | Status: DC | PRN

## 2023-08-18 MED ORDER — FENTANYL CITRATE (PF) 250 MCG/5ML IJ SOLN
INTRAMUSCULAR | Status: AC
  Filled 2023-08-18: qty 5

## 2023-08-18 MED ORDER — AMISULPRIDE (ANTIEMETIC) 5 MG/2ML IV SOLN: 10.0000 mg | Freq: Once | INTRAVENOUS | Status: DC | PRN

## 2023-08-18 MED ORDER — PANTOPRAZOLE SODIUM 40 MG PO TBEC
40.0000 mg | DELAYED_RELEASE_TABLET | Freq: Every day | ORAL | Status: DC
Start: 2023-08-18 — End: 2023-08-19
  Administered 2023-08-18: 40 mg via ORAL
  Filled 2023-08-18: qty 1

## 2023-08-18 MED ORDER — ONDANSETRON HCL 4 MG PO TABS: 4.0000 mg | ORAL_TABLET | Freq: Four times a day (QID) | ORAL | Status: DC | PRN

## 2023-08-18 MED ORDER — DIPHENHYDRAMINE HCL 12.5 MG/5ML PO ELIX: 12.5000 mg | ORAL_SOLUTION | ORAL | Status: DC | PRN

## 2023-08-18 MED ORDER — OXYCODONE HCL 5 MG PO TABS
10.0000 mg | ORAL_TABLET | ORAL | Status: DC | PRN
  Administered 2023-08-18 – 2023-08-19 (×2): 10 mg via ORAL

## 2023-08-18 MED ORDER — PHENYLEPHRINE HCL-NACL 20-0.9 MG/250ML-% IV SOLN
INTRAVENOUS | Status: DC | PRN
  Administered 2023-08-18: 20 ug/min via INTRAVENOUS

## 2023-08-18 MED ORDER — ACETAMINOPHEN 325 MG PO TABS: 325.0000 mg | ORAL_TABLET | Freq: Four times a day (QID) | ORAL | Status: DC | PRN

## 2023-08-18 MED ORDER — ONDANSETRON HCL 4 MG/2ML IJ SOLN: 4.0000 mg | Freq: Once | INTRAMUSCULAR | Status: DC | PRN

## 2023-08-18 MED ORDER — METHOCARBAMOL 500 MG PO TABS
500.0000 mg | ORAL_TABLET | Freq: Four times a day (QID) | ORAL | Status: DC | PRN
  Administered 2023-08-18: 500 mg via ORAL
  Filled 2023-08-18: qty 1

## 2023-08-18 MED ORDER — CARVEDILOL 3.125 MG PO TABS
3.1250 mg | ORAL_TABLET | Freq: Two times a day (BID) | ORAL | Status: DC
  Administered 2023-08-18: 3.125 mg via ORAL
  Filled 2023-08-18: qty 1

## 2023-08-18 MED ORDER — METOCLOPRAMIDE HCL 5 MG PO TABS: 5.0000 mg | ORAL_TABLET | Freq: Three times a day (TID) | ORAL | Status: DC | PRN

## 2023-08-18 MED ORDER — PROPOFOL 10 MG/ML IV BOLUS
INTRAVENOUS | Status: AC
  Filled 2023-08-18: qty 20

## 2023-08-18 MED ORDER — OXYCODONE HCL 5 MG PO TABS
5.0000 mg | ORAL_TABLET | ORAL | Status: DC | PRN
  Administered 2023-08-19 (×2): 10 mg via ORAL
  Filled 2023-08-18 (×5): qty 2

## 2023-08-18 MED ORDER — MENTHOL 3 MG MT LOZG
1.0000 | LOZENGE | OROMUCOSAL | Status: DC | PRN
Start: 2023-08-18 — End: 2023-08-19

## 2023-08-18 SURGICAL SUPPLY — 43 items
BAG COUNTER SPONGE SURGICOUNT (BAG) ×1 IMPLANT
BENZOIN TINCTURE PRP APPL 2/3 (GAUZE/BANDAGES/DRESSINGS) ×1 IMPLANT
BLADE CLIPPER SURG (BLADE) IMPLANT
BLADE SAW SGTL 18X1.27X75 (BLADE) ×1 IMPLANT
COVER SURGICAL LIGHT HANDLE (MISCELLANEOUS) ×1 IMPLANT
DRAPE C-ARM 42X72 X-RAY (DRAPES) ×1 IMPLANT
DRAPE STERI IOBAN 125X83 (DRAPES) ×1 IMPLANT
DRAPE U-SHAPE 47X51 STRL (DRAPES) ×3 IMPLANT
DRSG AQUACEL AG ADV 3.5X10 (GAUZE/BANDAGES/DRESSINGS) IMPLANT
DURAPREP 26ML APPLICATOR (WOUND CARE) ×1 IMPLANT
ELECT BLADE 6.5 EXT (BLADE) IMPLANT
ELECTRODE BLDE 4.0 EZ CLN MEGD (MISCELLANEOUS) ×1 IMPLANT
ELECTRODE REM PT RTRN 9FT ADLT (ELECTROSURGICAL) ×1 IMPLANT
FACESHIELD WRAPAROUND (MASK) ×2 IMPLANT
FACESHIELD WRAPAROUND OR TEAM (MASK) ×2 IMPLANT
GLOVE BIOGEL PI IND STRL 8 (GLOVE) ×2 IMPLANT
GLOVE ECLIPSE 8.0 STRL XLNG CF (GLOVE) ×1 IMPLANT
GLOVE ORTHO TXT STRL SZ7.5 (GLOVE) ×2 IMPLANT
GOWN STRL REUS W/ TWL LRG LVL3 (GOWN DISPOSABLE) ×2 IMPLANT
GOWN STRL REUS W/ TWL XL LVL3 (GOWN DISPOSABLE) ×2 IMPLANT
HEAD CERAMIC 36 PLUS5 (Hips) IMPLANT
KIT BASIN OR (CUSTOM PROCEDURE TRAY) ×1 IMPLANT
KIT TURNOVER KIT B (KITS) ×1 IMPLANT
LINER NEUTRAL 52X36MM PLUS 4 (Liner) IMPLANT
MANIFOLD NEPTUNE II (INSTRUMENTS) ×1 IMPLANT
NS IRRIG 1000ML POUR BTL (IV SOLUTION) ×1 IMPLANT
PACK TOTAL JOINT (CUSTOM PROCEDURE TRAY) ×1 IMPLANT
PAD ARMBOARD POSITIONER FOAM (MISCELLANEOUS) ×1 IMPLANT
PIN SECTOR W/GRIP ACE CUP 52MM (Hips) IMPLANT
SET HNDPC FAN SPRY TIP SCT (DISPOSABLE) ×1 IMPLANT
STAPLER SKIN PROX 35W (STAPLE) IMPLANT
STEM FEM ACTIS HIGH SZ3 (Stem) IMPLANT
STRIP CLOSURE SKIN 1/2X4 (GAUZE/BANDAGES/DRESSINGS) ×2 IMPLANT
SUT ETHIBOND NAB CT1 #1 30IN (SUTURE) ×1 IMPLANT
SUT MNCRL AB 4-0 PS2 18 (SUTURE) IMPLANT
SUT VIC AB 0 CT1 27XBRD ANBCTR (SUTURE) ×1 IMPLANT
SUT VIC AB 1 CT1 27XBRD ANBCTR (SUTURE) ×1 IMPLANT
SUT VIC AB 2-0 CT1 TAPERPNT 27 (SUTURE) ×1 IMPLANT
TOWEL GREEN STERILE (TOWEL DISPOSABLE) ×1 IMPLANT
TOWEL GREEN STERILE FF (TOWEL DISPOSABLE) ×1 IMPLANT
TRAY CATH INTERMITTENT SS 16FR (CATHETERS) IMPLANT
TRAY FOLEY W/BAG SLVR 16FR ST (SET/KITS/TRAYS/PACK) IMPLANT
WATER STERILE IRR 1000ML POUR (IV SOLUTION) ×2 IMPLANT

## 2023-08-18 NOTE — Anesthesia Procedure Notes (Signed)
 Spinal  Patient location during procedure: OR Start time: 08/18/2023 7:27 AM End time: 08/18/2023 7:30 AM Reason for block: surgical anesthesia Staffing Performed: anesthesiologist  Anesthesiologist: Corinne Garnette BRAVO, MD Performed by: Corinne Garnette BRAVO, MD Authorized by: Corinne Garnette BRAVO, MD   Preanesthetic Checklist Completed: patient identified, IV checked, risks and benefits discussed, surgical consent, monitors and equipment checked, pre-op evaluation and timeout performed Spinal Block Patient position: sitting Prep: DuraPrep and site prepped and draped Patient monitoring: continuous pulse ox and blood pressure Approach: midline Location: L3-4 Injection technique: single-shot Needle Needle type: Pencan  Needle gauge: 24 G Assessment Events: CSF return Additional Notes Functioning IV was confirmed and monitors were applied. Sterile prep and drape, including hand hygiene, mask and sterile gloves were used. The patient was positioned and the spine was prepped. The skin was anesthetized with lidocaine .  Free flow of clear CSF was obtained prior to injecting local anesthetic into the CSF.  The spinal needle aspirated freely following injection.  The needle was carefully withdrawn.  The patient tolerated the procedure well. Consent was obtained prior to procedure with all questions answered and concerns addressed. Risks including but not limited to bleeding, infection, nerve damage, paralysis, failed block, inadequate analgesia, allergic reaction, high spinal, itching and headache were discussed and the patient wished to proceed.   Garnette Corinne, MD

## 2023-08-18 NOTE — Evaluation (Signed)
 Physical Therapy Evaluation Patient Details Name: Alexis Ibarra MRN: [id number] DOB: 04/08/1965 Today's Date: 08/18/2023  History of Present Illness  Pt is a 58 y.o. F presenting to [facility] on 08/19/23 for elective L THA. PMH is significant for anxiety, depression, HTN, sleep apnea, and Takotsubo cardiomyopathy.  Clinical Impression  Prior to admittance pt was mobilizing independently w/out an AD and was independent w/ all ADLs. Pt presents to evaluation with deficits in mobility, strength, balance, power, activity tolerance, and pain, all limiting pt's ability to mobilize near baseline. Pt was able to ambulate w/ an AD and no physical assistance. Pt was given THA exercise packet and educated on contents. Therapist provided further education on the importance of modalities and mobilizing frequently. Pt would benefit from further gait, and stair training. PT will continue to treat pt while she is admitted.         If plan is discharge home, recommend the following: A little help with walking and/or transfers;A little help with bathing/dressing/bathroom;Assist for transportation;Help with stairs or ramp for entrance   Can travel by private vehicle        Equipment Recommendations None recommended by PT  Recommendations for Other Services       Functional Status Assessment Patient has had a recent decline in their functional status and demonstrates the ability to make significant improvements in function in a reasonable and predictable amount of time.     Precautions / Restrictions Precautions Precautions: Fall Recall of Precautions/Restrictions: Intact Restrictions Weight Bearing Restrictions Per Provider Order: Yes LLE Weight Bearing Per Provider Order: Weight bearing as tolerated      Mobility  Bed Mobility Overal bed mobility: Needs Assistance Bed Mobility: Supine to Sit     Supine to sit: Supervision, HOB elevated     General bed mobility comments: increased time to complete     Transfers Overall transfer level: Needs assistance Equipment used: Rolling walker (2 wheels) Transfers: Sit to/from Stand Sit to Stand: Contact guard assist           General transfer comment: VC given for sequencing; increased time to complete.    Ambulation/Gait Ambulation/Gait assistance: Contact guard assist Gait Distance (Feet): 100 Feet Assistive device: Rolling walker (2 wheels) Gait Pattern/deviations: Step-through pattern, Decreased step length - left, Decreased stance time - left, Decreased stride length, Decreased weight shift to left, Antalgic Gait velocity: reduced Gait velocity interpretation: <1.8 ft/sec, indicate of risk for recurrent falls   General Gait Details: Pt progressed from step-to gait to reciprocal gait w/ reliance on RW for stability.  Stairs            Wheelchair Mobility     Tilt Bed    Modified Rankin (Stroke Patients Only)       Balance Overall balance assessment: Needs assistance Sitting-balance support: No upper extremity supported, Feet supported Sitting balance-Leahy Scale: Good Sitting balance - Comments: seated EOB   Standing balance support: Bilateral upper extremity supported, During functional activity, Reliant on assistive device for balance Standing balance-Leahy Scale: Poor Standing balance comment: reliant on external support                             Pertinent Vitals/Pain Pain Assessment Pain Assessment: 0-10 Pain Score: 7  Pain Location: L hip Pain Descriptors / Indicators: Aching, Discomfort, Grimacing, Moaning, Constant Pain Intervention(s): Limited activity within patient's tolerance, Monitored during session, Premedicated before session    Home Living Family/patient expects to be discharged  to:: Private residence Living Arrangements: Spouse/significant other Available Help at Discharge: Family;Available 24 hours/day Type of Home: House Home Access: Stairs to enter Entrance  Stairs-Rails: None Entrance Stairs-Number of Steps: 2   Home Layout: One level Home Equipment: Agricultural consultant (2 wheels);Cane - single point;Toilet riser;Shower seat      Prior Function Prior Level of Function : Independent/Modified Independent;Driving             Mobility Comments: independent w/out an AD ADLs Comments: independent     Extremity/Trunk Assessment   Upper Extremity Assessment Upper Extremity Assessment: Overall WFL for tasks assessed    Lower Extremity Assessment Lower Extremity Assessment: LLE deficits/detail LLE Deficits / Details: reduced strength as per functional assessment LLE: Unable to fully assess due to pain LLE Sensation: WNL LLE Coordination: decreased gross motor    Cervical / Trunk Assessment Cervical / Trunk Assessment: Normal  Communication   Communication Communication: No apparent difficulties    Cognition Arousal: Alert Behavior During Therapy: WFL for tasks assessed/performed   PT - Cognitive impairments: No apparent impairments                         Following commands: Intact       Cueing Cueing Techniques: Verbal cues     General Comments General comments (skin integrity, edema, etc.): pt reports feeling hot w/ diaphoresis; RN aware    Exercises     Assessment/Plan    PT Assessment Patient needs continued PT services  PT Problem List Decreased strength;Decreased activity tolerance;Decreased range of motion;Decreased balance;Decreased mobility;Decreased coordination;Decreased knowledge of use of DME;Pain       PT Treatment Interventions DME instruction;Gait training;Stair training;Functional mobility training;Therapeutic activities;Therapeutic exercise;Balance training;Neuromuscular re-education;Patient/family education;Wheelchair mobility training;Manual techniques;Modalities    PT Goals (Current goals can be found in the Care Plan section)  Acute Rehab PT Goals Patient Stated Goal: to go home PT  Goal Formulation: With patient Time For Goal Achievement: 08/22/23 Potential to Achieve Goals: Good    Frequency 7X/week     Co-evaluation               AM-PAC PT 6 Clicks Mobility  Outcome Measure Help needed turning from your back to your side while in a flat bed without using bedrails?: A Little Help needed moving from lying on your back to sitting on the side of a flat bed without using bedrails?: A Little Help needed moving to and from a bed to a chair (including a wheelchair)?: A Little Help needed standing up from a chair using your arms (e.g., wheelchair or bedside chair)?: A Little Help needed to walk in hospital room?: A Little Help needed climbing 3-5 steps with a railing? : A Lot 6 Click Score: 17    End of Session Equipment Utilized During Treatment: Gait belt Activity Tolerance: Patient tolerated treatment well Patient left: in chair;with call bell/phone within reach;with family/visitor present Nurse Communication: Mobility status;Other (comment) (diaphoresis) PT Visit Diagnosis: Unsteadiness on feet (R26.81);Muscle weakness (generalized) (M62.81);Pain Pain - Right/Left: Left Pain - part of body: Hip    Time: 8663-8644 PT Time Calculation (min) (ACUTE ONLY): 19 min   Charges:   PT Evaluation $PT Eval Low Complexity: 1 Low   PT General Charges $$ ACUTE PT VISIT: 1 Visit         Leontine Hilt, SPT Acute Rehab [phone]   Leontine Hilt 08/18/2023, 3:25 PM

## 2023-08-18 NOTE — Interval H&P Note (Signed)
 History and Physical Interval Note: The patient understands that she is here today for a left total hip replacement.  Her significant left hip pain and arthritis.  There has been no acute or interval change in her medical status.  The risks and benefits of surgery have been discussed in detail and informed consent has been obtained.  The left operative hip has been marked.  08/18/2023 7:11 AM  Alexis Ibarra  has presented today for surgery, with the diagnosis of left hip osteoarthritis.  The various methods of treatment have been discussed with the patient and family. After consideration of risks, benefits and other options for treatment, the patient has consented to  Procedure(s): ARTHROPLASTY, HIP, TOTAL, ANTERIOR APPROACH (Left) as a surgical intervention.  The patient's history has been reviewed, patient examined, no change in status, stable for surgery.  I have reviewed the patient's chart and labs.  Questions were answered to the patient's satisfaction.     Alexis Ibarra

## 2023-08-18 NOTE — Op Note (Signed)
 Operative Note  Date of operation: 08/18/2023 Preoperative diagnosis: Left hip primary osteoarthritis Postoperative diagnosis: Same  Procedure: Left direct anterior total hip arthroplasty  Implants: DePuy Sectra GRIPTION acetabular component size 52, 36+4 polythene liner, Actis femoral component with high offset size 3, 36+5 ceramic head ball  Surgeon: Lonni GRADE. Vernetta, MD Assistant: Tory Gaskins, PA-C  Anesthesia: Spinal EBL: 150 cc Antibiotics: IV Ancef Complications: None  Indications: The patient is a 58 year old female unfortunately has debilitating arthritis involving her left hip.  This is been well-documented on clinical exam and x-ray findings.  Her x-rays show bone-on-bone wear of that hip.  She has tried and failed conservative treatment for a while now and at this point her left hip pain is daily and it is definitely affecting her mobility, her quality of life and her actives daily living.  We did describe in length in detail the risks of acute blood loss anemia, nerve vessel injury, fracture, infection, DVT, dislocation, implant failure, leg length differences and wound healing issues.  She understands that our goals are hopefully decreased pain, improved mobility and improved quality of life.  Procedure description: After informed consent was obtained and the appropriate left hip was marked, the patient was brought to the operating room and set up on the stretcher where spinal anesthesia was obtained.  The patient was then laid in a supine position on stretcher and a Foley catheter was placed.  Next traction boots were placed on both her feet and she was placed supine on the Hana fracture table with a perineal post in place and both legs in inline skeletal traction devices but no traction applied.  Her left operative hip and pelvis were assessed radiographically.  The left hip was prepped and draped in DuraPrep and sterile drapes.  A timeout was called and she was identified  as the correct patient and correct left hip.  An incision was then made just inferior and posterior to the ASIS and carried slightly bleak down the leg.  Dissection was carried down to the tensor fascia lata muscle and the tensor fascia was divided longitudinally to proceed with a direct tender approach to the hip.  Circumflex vessels were identified and cauterized.  The hip capsule was then divided and opened up in L-type format finding a moderate joint effusion.  Cobra retractors were placed around the medial and lateral femoral neck and a femoral neck cut was made with an oscillating saw just proximal to the lesser trochanter and this cut was completed with an osteotome.  A corkscrew guide was placed in the femoral head and the femoral head was removed in its entirety and there is a wide area of devoid of cartilage with bone-on-bone cartilage loss.  A bent Hohmann was then placed over the medial ostial rim and remnants of the acetabular labrum and other debris removed.  Reaming was then initiated from a size 43 reamer and stepwise increments going up to a size 51 reamer with all reamers placed under direct visualization and the last reamer placed under direct fluoroscopy in order to obtain the depth of reaming, the inclination and the anteversion.  The real DePuy sector GRIPTION acetabular opponent size 52 was then placed without difficulty followed by a 36+4 polythene liner based on the patient's offset and medialization of the cup.  Attention was then turned to the femur.  With the left leg externally rotated to 120 degrees, extended and adducted, a Mueller retractor was placed medially and a Hohmann tractor behind the greater  trochanter.  The lateral joint capsule was released and a box cutting osteotome was used into the femoral canal.  Broaching was then initiated using the Actis broaching system from a size 0 going to a size 3.  With a size 3 in place we trialed a standard offset femoral neck and a 36+1.5  trial hip ball.  The left leg was brought over and up and with traction and internal rotation reduced in the pelvis.  Based on radiographic and clinical assessment we needed more offset and leg length.  We dislocated the hip and removed the trial components.  We then placed the real Actis femoral component with high offset size 3 and with the real 36+5 ceramic head ball.  Again this was reduced in the acetabulum we assessed it radiographically and clinically and replaced with leg length, offset, range of motion and stability.  The soft tissue was then irrigated normal saline solution.  The joint capsule was closed with interrupted #1 Ethibond suture followed by #1 Vicryl close the tensor fascia.  0 Vicryl was used to close the deep tissue and 2-0 Vicryl was used to close subcutaneous tissue.  The skin was closed with staples.  An Aquacel dressing was applied.  The patient was taken off the Hana table and taken the recovery room.  Tory Gaskins, PA-C did assist during the entire case and beginning the end and his assistance was crucial and medically necessary for soft tissue management and retraction, helping guide implant placement and a layered closure of the wound.

## 2023-08-18 NOTE — Anesthesia Postprocedure Evaluation (Signed)
 Anesthesia Post Note  Patient: Alexis Ibarra  Procedure(s) Performed: ARTHROPLASTY, HIP, TOTAL, ANTERIOR APPROACH, LEFT (Left: Hip)     Patient location during evaluation: PACU Anesthesia Type: Spinal Level of consciousness: awake, awake and alert and oriented Pain management: pain level controlled Vital Signs Assessment: post-procedure vital signs reviewed and stable Respiratory status: spontaneous breathing, nonlabored ventilation and respiratory function stable Cardiovascular status: blood pressure returned to baseline and stable Postop Assessment: no headache, no backache, spinal receding and no apparent nausea or vomiting Anesthetic complications: no   No notable events documented.  Last Vitals:  Vitals:   08/18/23 1214 08/18/23 1229  BP:  (!) 104/52  Pulse: 72 68  Resp: 12 20  Temp: 36.7 C 36.5 C  SpO2: 97% 100%    Last Pain:  Vitals:   08/18/23 1259  TempSrc:   PainSc: 8                  Garnette FORBES Skillern

## 2023-08-18 NOTE — Transfer of Care (Signed)
 Immediate Anesthesia Transfer of Care Note  Patient: Alexis Ibarra  Procedure(s) Performed: ARTHROPLASTY, HIP, TOTAL, ANTERIOR APPROACH, LEFT (Left: Hip)  Patient Location: PACU  Anesthesia Type:MAC and Spinal  Level of Consciousness: drowsy  Airway & Oxygen Therapy: Patient Spontanous Breathing and Patient connected to face mask oxygen  Post-op Assessment: Report given to RN and Post -op Vital signs reviewed and stable  Post vital signs: Reviewed and stable  Last Vitals:  Vitals Value Taken Time  BP 98/47 08/18/23 08:49  Temp    Pulse 60 08/18/23 08:52  Resp 15 08/18/23 08:52  SpO2 100 % 08/18/23 08:52  Vitals shown include unfiled device data.  Last Pain:  Vitals:   08/18/23 0631  TempSrc:   PainSc: 0-No pain      Patients Stated Pain Goal: 0 (08/18/23 0622)  Complications: No notable events documented.

## 2023-08-18 NOTE — Discharge Instructions (Signed)
 Per [facility] clinic policy, our goal is ensure optimal postoperative pain control with a multimodal pain management strategy. For all [facility] patients, our goal is to wean post-operative narcotic medications by 6 weeks post-operatively. If this is not possible due to utilization of pain medication prior to surgery, your [facility] doctor will support your acute post-operative pain control for the first 6 weeks postoperatively, with a plan to transition you back to your primary pain team following that. Alexis Ibarra will work to ensure a Therapist, occupational.  INSTRUCTIONS AFTER JOINT REPLACEMENT   Remove items at home which could result in a fall. This includes throw rugs or furniture in walking pathways ICE to the affected joint every three hours while awake for 30 minutes at a time, for at least the first 3-5 days, and then as needed for pain and swelling.  Continue to use ice for pain and swelling. You may notice swelling that will progress down to the foot and ankle.  This is normal after surgery.  Elevate your leg when you are not up walking on it.   Continue to use the breathing machine you got in the hospital (incentive spirometer) which will help keep your temperature down.  It is common for your temperature to cycle up and down following surgery, especially at night when you are not up moving around and exerting yourself.  The breathing machine keeps your lungs expanded and your temperature down.   DIET:  As you were doing prior to hospitalization, we recommend a well-balanced diet.  DRESSING / WOUND CARE / SHOWERING  Keep the surgical dressing until follow up.  The dressing is water proof, so you can shower without any extra covering.  IF THE DRESSING FALLS OFF or the wound gets wet inside, change the dressing with sterile gauze.  Please use good hand washing techniques before changing the dressing.  Do not use any lotions or creams on the incision until instructed by your surgeon.     ACTIVITY  Increase activity slowly as tolerated, but follow the weight bearing instructions below.   No driving for 6 weeks or until further direction given by your physician.  You cannot drive while taking narcotics.  No lifting or carrying greater than 10 lbs. until further directed by your surgeon. Avoid periods of inactivity such as sitting longer than an hour when not asleep. This helps prevent blood clots.  You may return to work once you are authorized by your doctor.     WEIGHT BEARING   Weight bearing as tolerated with assist device (walker, cane, etc) as directed, use it as long as suggested by your surgeon or therapist, typically at least 4-6 weeks.   EXERCISES  Results after joint replacement surgery are often greatly improved when you follow the exercise, range of motion and muscle strengthening exercises prescribed by your doctor. Safety measures are also important to protect the joint from further injury. Any time any of these exercises cause you to have increased pain or swelling, decrease what you are doing until you are comfortable again and then slowly increase them. If you have problems or questions, call your caregiver or physical therapist for advice.   Rehabilitation is important following a joint replacement. After just a few days of immobilization, the muscles of the leg can become weakened and shrink (atrophy).  These exercises are designed to build up the tone and strength of the thigh and leg muscles and to improve motion. Often times heat used for twenty to thirty minutes before  working out will loosen up your tissues and help with improving the range of motion but do not use heat for the first two weeks following surgery (sometimes heat can increase post-operative swelling).   These exercises can be done on a training (exercise) mat, on the floor, on a table or on a bed. Use whatever works the best and is most comfortable for you.    Use music or television  while you are exercising so that the exercises are a pleasant break in your day. This will make your life better with the exercises acting as a break in your routine that you can look forward to.   Perform all exercises about fifteen times, three times per day or as directed.  You should exercise both the operative leg and the other leg as well.  Exercises include:   Quad Sets - Tighten up the muscle on the front of the thigh (Quad) and hold for 5-10 seconds.   Straight Leg Raises - With your knee straight (if you were given a brace, keep it on), lift the leg to 60 degrees, hold for 3 seconds, and slowly lower the leg.  Perform this exercise against resistance later as your leg gets stronger.  Leg Slides: Lying on your back, slowly slide your foot toward your buttocks, bending your knee up off the floor (only go as far as is comfortable). Then slowly slide your foot back down until your leg is flat on the floor again.  Angel Wings: Lying on your back spread your legs to the side as far apart as you can without causing discomfort.  Hamstring Strength:  Lying on your back, push your heel against the floor with your leg straight by tightening up the muscles of your buttocks.  Repeat, but this time bend your knee to a comfortable angle, and push your heel against the floor.  You may put a pillow under the heel to make it more comfortable if necessary.   A rehabilitation program following joint replacement surgery can speed recovery and prevent re-injury in the future due to weakened muscles. Contact your doctor or a physical therapist for more information on knee rehabilitation.    CONSTIPATION  Constipation is defined medically as fewer than three stools per week and severe constipation as less than one stool per week.  Even if you have a regular bowel pattern at home, your normal regimen is likely to be disrupted due to multiple reasons following surgery.  Combination of anesthesia, postoperative  narcotics, change in appetite and fluid intake all can affect your bowels.   YOU MUST use at least one of the following options; they are listed in order of increasing strength to get the job done.  They are all available over the counter, and you may need to use some, POSSIBLY even all of these options:    Drink plenty of fluids (prune juice may be helpful) and high fiber foods Colace 100 mg by mouth twice a day  Senokot for constipation as directed and as needed Dulcolax (bisacodyl), take with full glass of water  Miralax (polyethylene glycol) once or twice a day as needed.  If you have tried all these things and are unable to have a bowel movement in the first 3-4 days after surgery call either your surgeon or your primary doctor.    If you experience loose stools or diarrhea, hold the medications until you stool forms back up.  If your symptoms do not get better within 1 week  or if they get worse, check with your doctor.  If you experience "the worst abdominal pain ever" or develop nausea or vomiting, please contact the office immediately for further recommendations for treatment.   ITCHING:  If you experience itching with your medications, try taking only a single pain pill, or even half a pain pill at a time.  You can also use Benadryl over the counter for itching or also to help with sleep.   TED HOSE STOCKINGS:  Use stockings on both legs until for at least 2 weeks or as directed by physician office. They may be removed at night for sleeping.  MEDICATIONS:  See your medication summary on the "After Visit Summary" that nursing will review with you.  You may have some home medications which will be placed on hold until you complete the course of blood thinner medication.  It is important for you to complete the blood thinner medication as prescribed.  PRECAUTIONS:  If you experience chest pain or shortness of breath - call 911 immediately for transfer to the hospital emergency department.    If you develop a fever greater that 101 F, purulent drainage from wound, increased redness or drainage from wound, foul odor from the wound/dressing, or calf pain - CONTACT YOUR SURGEON.                                                   FOLLOW-UP APPOINTMENTS:  If you do not already have a post-op appointment, please call the office for an appointment to be seen by your surgeon.  Guidelines for how soon to be seen are listed in your "After Visit Summary", but are typically between 1-4 weeks after surgery.  OTHER INSTRUCTIONS:   Knee Replacement:  Do not place pillow under knee, focus on keeping the knee straight while resting. CPM instructions: 0-90 degrees, 2 hours in the morning, 2 hours in the afternoon, and 2 hours in the evening. Place foam block, curve side up under heel at all times except when in CPM or when walking.  DO NOT modify, tear, cut, or change the foam block in any way.  POST-OPERATIVE OPIOID TAPER INSTRUCTIONS: It is important to wean off of your opioid medication as soon as possible. If you do not need pain medication after your surgery it is ok to stop day one. Opioids include: Codeine, Hydrocodone(Norco, Vicodin), Oxycodone(Percocet, oxycontin) and hydromorphone amongst others.  Long term and even short term use of opiods can cause: Increased pain response Dependence Constipation Depression Respiratory depression And more.  Withdrawal symptoms can include Flu like symptoms Nausea, vomiting And more Techniques to manage these symptoms Hydrate well Eat regular healthy meals Stay active Use relaxation techniques(deep breathing, meditating, yoga) Do Not substitute Alcohol to help with tapering If you have been on opioids for less than two weeks and do not have pain than it is ok to stop all together.  Plan to wean off of opioids This plan should start within one week post op of your joint replacement. Maintain the same interval or time between taking each dose  and first decrease the dose.  Cut the total daily intake of opioids by one tablet each day Next start to increase the time between doses. The last dose that should be eliminated is the evening dose.   MAKE SURE YOU:  Understand these instructions.  Get help right away if you are not doing well or get worse.    Thank you for letting us be a part of your medical care team.  It is a privilege we respect greatly.  We hope these instructions will help you stay on track for a fast and full recovery!      Dental Antibiotics:  In most cases prophylactic antibiotics for Dental procdeures after total joint surgery are not necessary.  Exceptions are as follows:  1. History of prior total joint infection  2. Severely immunocompromised (Organ Transplant, cancer chemotherapy, Rheumatoid biologic meds such as Humera)  3. Poorly controlled diabetes (A1C &gt; 8.0, blood glucose over 200)  If you have one of these conditions, contact your surgeon for an antibiotic prescription, prior to your dental procedure.

## 2023-08-19 ENCOUNTER — Encounter (HOSPITAL_COMMUNITY): Payer: Self-pay | Admitting: Orthopaedic Surgery

## 2023-08-19 DIAGNOSIS — M1612 Unilateral primary osteoarthritis, left hip: Secondary | ICD-10-CM | POA: Diagnosis not present

## 2023-08-19 LAB — BASIC METABOLIC PANEL WITH GFR
Anion gap: 14 (ref 5–15)
BUN: 12 mg/dL (ref 6–20)
CO2: 21 mmol/L — ABNORMAL LOW (ref 22–32)
Calcium: 8.6 mg/dL — ABNORMAL LOW (ref 8.9–10.3)
Chloride: 100 mmol/L (ref 98–111)
Creatinine, Ser: 0.79 mg/dL (ref 0.44–1.00)
GFR, Estimated: >60 mL/min (ref >60)
Glucose, Bld: 126 mg/dL — ABNORMAL HIGH (ref 70–99)
Potassium: 4.1 mmol/L (ref 3.5–5.1)
Sodium: 135 mmol/L (ref 135–145)

## 2023-08-19 LAB — CBC
HCT: 29.6 % — ABNORMAL LOW (ref 36.0–46.0)
Hemoglobin: 10.4 g/dL — ABNORMAL LOW (ref 12.0–15.0)
MCH: 30.8 pg (ref 26.0–34.0)
MCHC: 35.1 g/dL (ref 30.0–36.0)
MCV: 87.6 fL (ref 80.0–100.0)
Platelets: 284 10*3/uL (ref 150–400)
RBC: 3.38 MIL/uL — ABNORMAL LOW (ref 3.87–5.11)
RDW: 12.5 % (ref 11.5–15.5)
WBC: 11.8 10*3/uL — ABNORMAL HIGH (ref 4.0–10.5)
nRBC: 0 % (ref 0.0–0.2)

## 2023-08-19 MED ORDER — ASPIRIN 81 MG PO CHEW: 81.0000 mg | CHEWABLE_TABLET | Freq: Two times a day (BID) | ORAL | 0 refills | Status: AC

## 2023-08-19 MED ORDER — METHOCARBAMOL 500 MG PO TABS: 500.0000 mg | ORAL_TABLET | Freq: Four times a day (QID) | ORAL | 1 refills | Status: DC | PRN

## 2023-08-19 MED ORDER — OXYCODONE HCL 5 MG PO TABS: 5.0000 mg | ORAL_TABLET | Freq: Four times a day (QID) | ORAL | 0 refills | Status: DC | PRN

## 2023-08-19 NOTE — Progress Notes (Signed)
 Subjective: 1 Day Post-Op Procedure(s) (LRB): ARTHROPLASTY, HIP, TOTAL, ANTERIOR APPROACH, LEFT (Left) Patient reports pain as moderate.    Objective: Vital signs in last 24 hours: Temp:  [97.6 F (36.4 C)-99.9 F (37.7 C)] 99.9 F (37.7 C) (06/25 0558) Pulse Rate:  [58-88] 87 (06/25 0558) Resp:  [12-20] 18 (06/25 0558) BP: (95-143)/(47-92) 143/92 (06/25 0558) SpO2:  [93 %-100 %] 100 % (06/25 0558)  Intake/Output from previous day: 06/24 0701 - 06/25 0700 In: 800 [I.V.:500; IV Piggyback:300] Out: 850 [Urine:700; Blood:150] Intake/Output this shift: No intake/output data recorded.  Recent Labs    08/19/23 0558  HGB 10.4*   Recent Labs    08/19/23 0558  WBC 11.8*  RBC 3.38*  HCT 29.6*  PLT 284   No results for input(s): NA, K, CL, CO2, BUN, CREATININE, GLUCOSE, CALCIUM  in the last 72 hours. No results for input(s): LABPT, INR in the last 72 hours.  Sensation intact distally Intact pulses distally Dorsiflexion/Plantar flexion intact Incision: scant drainage   Assessment/Plan: 1 Day Post-Op Procedure(s) (LRB): ARTHROPLASTY, HIP, TOTAL, ANTERIOR APPROACH, LEFT (Left) Up with therapy Discharge home with home health      Lonni CINDERELLA Poli 08/19/2023, 7:42 AM

## 2023-08-19 NOTE — Discharge Summary (Signed)
 Patient ID: Alexis Ibarra MRN: [id number] DOB/AGE: 1966-01-30 58 y.o.  Admit date: 08/18/2023 Discharge date: 08/19/2023  Admission Diagnoses:  Principal Problem:   Unilateral primary osteoarthritis, left hip Active Problems:   Status post total replacement of left hip   Discharge Diagnoses:  Same  Past Medical History:  Diagnosis Date   Acne    Anxiety    Depression    Hematochezia    Hypertension    Osteoarthritis of left hip    Sleep apnea    uses CPAP   Takotsubo cardiomyopathy    a. NSTEMI 12/2017 -> cath with normal coronaries, preserved EF but apical ballooning suggestive of Takotsubo cardiomyopathy.    Surgeries: Procedure(s): ARTHROPLASTY, HIP, TOTAL, ANTERIOR APPROACH, LEFT on 08/18/2023   Consultants:   Discharged Condition: Improved  Hospital Course: Alexis Ibarra is an 58 y.o. female who was admitted 08/18/2023 for operative treatment ofUnilateral primary osteoarthritis, left hip. Patient has severe unremitting pain that affects sleep, daily activities, and work/hobbies. After pre-op clearance the patient was taken to the operating room on 08/18/2023 and underwent  Procedure(s): ARTHROPLASTY, HIP, TOTAL, ANTERIOR APPROACH, LEFT.    Patient was given perioperative antibiotics:  Anti-infectives (From admission, onward)    Start     Dose/Rate Route Frequency Ordered Stop   08/18/23 1315  ceFAZolin (ANCEF) IVPB 2g/100 mL premix        2 g 200 mL/hr over 30 Minutes Intravenous Every 6 hours 08/18/23 1222 08/18/23 2030   08/18/23 0600  ceFAZolin (ANCEF) IVPB 2g/100 mL premix        2 g 200 mL/hr over 30 Minutes Intravenous On call to O.R. 08/18/23 0559 08/18/23 0803        Patient was given sequential compression devices, early ambulation, and chemoprophylaxis to prevent DVT.  Patient benefited maximally from hospital stay and there were no complications.    Recent vital signs: Patient Vitals for the past 24 hrs:  BP Temp Temp src Pulse Resp SpO2  08/19/23 0811  105/63 99.1 F (37.3 C) Oral 93 16 100 %  08/19/23 0558 (!) 143/92 99.9 F (37.7 C) Oral 87 18 100 %  08/19/23 0033 115/60 98.6 F (37 C) Oral 88 18 100 %  08/18/23 2020 (!) 119/58 98.6 F (37 C) Oral 77 18 100 %  08/18/23 1541 (!) 108/50 97.9 F (36.6 C) Oral 73 16 100 %     Recent laboratory studies:  Recent Labs    08/19/23 0558  WBC 11.8*  HGB 10.4*  HCT 29.6*  PLT 284  NA 135  K 4.1  CL 100  CO2 21*  BUN 12  CREATININE 0.79  GLUCOSE 126*  CALCIUM  8.6*     Discharge Medications:   Allergies as of 08/19/2023   No Known Allergies      Medication List     STOP taking these medications    aspirin  EC 81 MG tablet Replaced by: aspirin  81 MG chewable tablet   traZODone  50 MG tablet Commonly known as: DESYREL        TAKE these medications    acetaminophen  325 MG tablet Commonly known as: TYLENOL  Take 650 mg by mouth every 6 (six) hours as needed for moderate pain (pain score 4-6).   aspirin  81 MG chewable tablet Chew 1 tablet (81 mg total) by mouth 2 (two) times daily. Replaces: aspirin  EC 81 MG tablet   buPROPion  300 MG 24 hr tablet Commonly known as: WELLBUTRIN  XL Take 1 tablet (300 mg total) by mouth daily.  busPIRone  15 MG tablet Commonly known as: BUSPAR  Take 1 tablet (15 mg total) by mouth 3 (three) times daily. What changed: when to take this   carvedilol  3.125 MG tablet Commonly known as: COREG  TAKE 1 TABLET(3.125 MG) BY MOUTH TWICE DAILY WITH A MEAL.   [employer] C-Lax Laxative 5 MG EC tablet Generic drug: bisacodyl Take 5 mg by mouth daily as needed for moderate constipation.   diphenhydrAMINE 25 mg capsule Commonly known as: BENADRYL Take 25 mg by mouth every 6 (six) hours as needed for sleep.   diphenhydramine-acetaminophen  25-500 MG Tabs tablet Commonly known as: TYLENOL  PM Take 2 tablets by mouth at bedtime as needed (sleep/pain).   docusate sodium 250 MG capsule Commonly known as: COLACE Take 250 mg by mouth daily.    doxycycline  40 MG capsule Commonly known as: ORACEA  Take 40 mg by mouth daily as needed (rosacea).   ibuprofen 200 MG tablet Commonly known as: ADVIL Take 400-800 mg by mouth every 6 (six) hours as needed for moderate pain (pain score 4-6).   LORazepam  0.5 MG tablet Commonly known as: Ativan  Take 0.5-1 tablets (0.25-0.5 mg total) by mouth every 8 (eight) hours as needed for anxiety.   losartan  25 MG tablet Commonly known as: COZAAR  TAKE 1/2 TABLET BY MOUTH DAILY   MegaRed Omega-3 Krill Oil 500 MG Caps Take 500 mg by mouth daily.   Melatonin 10 MG Tabs Take 10 mg by mouth at bedtime as needed (sleep).   methocarbamol 500 MG tablet Commonly known as: ROBAXIN Take 1 tablet (500 mg total) by mouth every 6 (six) hours as needed for muscle spasms.   metroNIDAZOLE 1 % gel Commonly known as: METROGEL Apply 1 Application topically daily.   naproxen sodium 220 MG tablet Commonly known as: ALEVE Take 220 mg by mouth daily as needed (pain).   nitroGLYCERIN  0.4 MG SL tablet Commonly known as: NITROSTAT  Place 1 tablet (0.4 mg total) under the tongue every 5 (five) minutes as needed for chest pain (up to 3 doses).   OVER THE COUNTER MEDICATION Take 2 capsules by mouth daily. Provitalize Supplement   oxyCODONE 5 MG immediate release tablet Commonly known as: Oxy IR/ROXICODONE Take 1-2 tablets (5-10 mg total) by mouth every 6 (six) hours as needed for moderate pain (pain score 4-6) (pain score 4-6).   rosuvastatin  5 MG tablet Commonly known as: CRESTOR  TAKE 1 TABLET BY MOUTH EVERY DAY IN THE EVENING   venlafaxine  100 MG tablet Commonly known as: EFFEXOR  2 po q am, 1 qhs               Durable Medical Equipment  (From admission, onward)           Start     Ordered   08/18/23 1223  DME 3 n 1  Once        08/18/23 1222   08/18/23 1223  DME Walker rolling  Once       Question Answer Comment  Walker: With 5 Inch Wheels   Patient needs a walker to treat with the  following condition Status post total replacement of left hip      08/18/23 1222            Diagnostic Studies: DG Pelvis Portable Result Date: 08/18/2023 CLINICAL DATA:  Status post left hip replacement. EXAM: PORTABLE PELVIS 1-2 VIEWS COMPARISON:  None Available. FINDINGS: Left hip arthroplasty in expected alignment. No periprosthetic lucency or fracture. Recent postsurgical change includes air and edema in the soft  tissues. Overlying skin staples in place. IMPRESSION: Left hip arthroplasty without immediate postoperative complication. Electronically Signed   By: Andrea Gasman M.D.   On: 08/18/2023 12:54   DG HIP UNILAT WITH PELVIS 1V LEFT Result Date: 08/18/2023 CLINICAL DATA:  Elective surgery. EXAM: DG HIP (WITH OR WITHOUT PELVIS) 1V*L* COMPARISON:  None Available. FINDINGS: Three fluoroscopic spot views of the pelvis and left hip obtained in the operating room. Images during hip arthroplasty. Fluoroscopy time 17 seconds. Dose 1.5603 mGy. IMPRESSION: Intraoperative fluoroscopy during left hip arthroplasty. Electronically Signed   By: Andrea Gasman M.D.   On: 08/18/2023 11:00   DG C-Arm 1-60 Min-No Report Result Date: 08/18/2023 Fluoroscopy was utilized by the requesting physician.  No radiographic interpretation.    Disposition: Discharge disposition: 01-Home or Self Care          Follow-up Information     [facility] Follow up.   Specialty: Home Health Services Why: [facility] will contact you for the first home visit Contact information: [address] [place] [address] [phone]         Vernetta Lonni GRADE, MD Follow up in 2 week(s).   Specialty: Orthopedic Surgery Contact information: [address] [place]  [place] [address] [phone]                  Signed: Lonni GRADE Vernetta 08/19/2023, 12:58 PM

## 2023-08-19 NOTE — Progress Notes (Signed)
 Physical Therapy Treatment  Patient Details Name: Alexis Ibarra MRN: [id number] DOB: [birth year] Today's Date: 08/19/2023   History of Present Illness Pt is a 57 y.o. F presenting to [facility] on 08/19/23 for elective L THA. PMH is significant for anxiety, depression, HTN, sleep apnea, and Takotsubo cardiomyopathy.    PT Comments  Pt progressing towards physical therapy goals. Was able to perform transfers and ambulation with gross min guard assist and RW for support. Reviewed HEP, car transfer, appropriate activity progression, and stairs negotiation. Pt anticipates d/c home today. Will continue to follow until d/c.     If plan is discharge home, recommend the following: A little help with walking and/or transfers;A little help with bathing/dressing/bathroom;Assist for transportation;Help with stairs or ramp for entrance   Can travel by private vehicle        Equipment Recommendations  None recommended by PT (Gait belt for stairs)    Recommendations for Other Services       Precautions / Restrictions Precautions Precautions: Fall Recall of Precautions/Restrictions: Intact Restrictions Weight Bearing Restrictions Per Provider Order: Yes LLE Weight Bearing Per Provider Order: Weight bearing as tolerated     Mobility  Bed Mobility Overal bed mobility: Needs Assistance Bed Mobility: Supine to Sit     Supine to sit: Supervision, HOB elevated     General bed mobility comments: Increased time due to pain. Pt reaching for spouse's hand to help pull her to EOB, but feel she could have completed without assist as well.    Transfers Overall transfer level: Needs assistance Equipment used: Rolling walker (2 wheels) Transfers: Sit to/from Stand Sit to Stand: Contact guard assist           General transfer comment: VC's for hand placement on seated surface for safety and to keep walker positioned in front of her when preparing to sit.    Ambulation/Gait Ambulation/Gait assistance:  Contact guard assist Gait Distance (Feet): 200 Feet Assistive device: Rolling walker (2 wheels) Gait Pattern/deviations: Step-through pattern, Decreased step length - left, Decreased stance time - left, Decreased stride length, Decreased weight shift to left, Antalgic Gait velocity: Decreased Gait velocity interpretation: 1.31 - 2.62 ft/sec, indicative of limited community ambulator   General Gait Details: Pt progressed from step-to gait to reciprocal gait w/ reliance on RW for stability.   Stairs Stairs: Yes Stairs assistance: Min assist Stair Management: No rails, Step to pattern, Forwards Number of Stairs: 3 General stair comments: Husband present for educaiton. Pt was able to negotiate 3 stairs with HHA and gait belt for safety.   Wheelchair Mobility     Tilt Bed    Modified Rankin (Stroke Patients Only)       Balance Overall balance assessment: Needs assistance Sitting-balance support: No upper extremity supported, Feet supported Sitting balance-Leahy Scale: Good Sitting balance - Comments: seated EOB   Standing balance support: Bilateral upper extremity supported, During functional activity, Reliant on assistive device for balance Standing balance-Leahy Scale: Poor Standing balance comment: reliant on external support                            Communication Communication Communication: No apparent difficulties  Cognition Arousal: Alert Behavior During Therapy: WFL for tasks assessed/performed   PT - Cognitive impairments: No apparent impairments                         Following commands: Intact      Cueing  Cueing Techniques: Verbal cues  Exercises Total Joint Exercises Ankle Circles/Pumps: 10 reps Quad Sets: 10 reps Short Arc Quad: 10 reps Heel Slides: 10 reps Hip ABduction/ADduction: 10 reps Long Arc Quad: 10 reps    General Comments        Pertinent Vitals/Pain Pain Assessment Pain Assessment: Faces Faces Pain Scale:  Hurts even more Pain Location: L hip Pain Descriptors / Indicators: Aching, Discomfort, Grimacing, Moaning, Constant Pain Intervention(s): Limited activity within patient's tolerance, Monitored during session, Repositioned    Home Living                          Prior Function            PT Goals (current goals can now be found in the care plan section) Acute Rehab PT Goals Patient Stated Goal: to go home PT Goal Formulation: With patient Time For Goal Achievement: 08/22/23 Potential to Achieve Goals: Good Progress towards PT goals: Progressing toward goals    Frequency    7X/week      PT Plan      Co-evaluation              AM-PAC PT 6 Clicks Mobility   Outcome Measure  Help needed turning from your back to your side while in a flat bed without using bedrails?: None Help needed moving from lying on your back to sitting on the side of a flat bed without using bedrails?: A Little Help needed moving to and from a bed to a chair (including a wheelchair)?: A Little Help needed standing up from a chair using your arms (e.g., wheelchair or bedside chair)?: A Little Help needed to walk in hospital room?: A Little Help needed climbing 3-5 steps with a railing? : A Little 6 Click Score: 19    End of Session Equipment Utilized During Treatment: Gait belt Activity Tolerance: Patient tolerated treatment well Patient left: in chair;with call bell/phone within reach;with family/visitor present Nurse Communication: Mobility status PT Visit Diagnosis: Unsteadiness on feet (R26.81);Muscle weakness (generalized) (M62.81);Pain Pain - Right/Left: Left Pain - part of body: Hip     Time: 9081-9050 PT Time Calculation (min) (ACUTE ONLY): 31 min  Charges:    $Gait Training: 8-22 mins $Therapeutic Exercise: 8-22 mins PT General Charges $$ ACUTE PT VISIT: 1 Visit                     Alexis Ibarra, PT, DPT Acute Rehabilitation Services Secure Chat  Preferred Office: [phone]    Alexis Ibarra 08/19/2023, 10:19 AM

## 2023-08-19 NOTE — Plan of Care (Signed)
 Pt doing well. Pt and husband given D/C instructions with verbal understanding. Rx's were sent to the pharmacy by MD. Pt's incision is clean and dry with no sign of infection. Pt's IV was removed prior to D/C. Pt D/C'd home via wheelchair per MD order. Pt is stable @ D/C and has no other needs at this time. Rema Fendt, RN

## 2023-08-24 ENCOUNTER — Encounter: Payer: Self-pay | Admitting: Orthopaedic Surgery

## 2023-08-26 ENCOUNTER — Other Ambulatory Visit: Payer: Self-pay | Admitting: Orthopaedic Surgery

## 2023-09-01 ENCOUNTER — Ambulatory Visit: Admitting: Psychiatry

## 2023-09-02 ENCOUNTER — Ambulatory Visit (INDEPENDENT_AMBULATORY_CARE_PROVIDER_SITE_OTHER): Admitting: Orthopaedic Surgery

## 2023-09-02 DIAGNOSIS — Z96642 Presence of left artificial hip joint: Secondary | ICD-10-CM

## 2023-09-02 NOTE — Progress Notes (Signed)
 The patient is a 58 year old female who is here at her first postoperative visit status post a left total hip arthroplasty to treat significant left hip arthritis.  She is doing well overall.  She has been compliant with wearing compressive hose and pumping her feet as well as taking a baby aspirin  twice daily.  She is already walking without an assistive device.  On exam her left hip incision looks good.  Steri-Strips have been applied.  There is a little bit of a hematoma but no significant seroma.  She will continue to slowly increase her activities as comfort allows.  She should wait 4 to 6 weeks before any scar creams and should wait at least 4 weeks before submerging underwater.  They do have a beach trip in early August and I am fine with her getting under water then if needed.  Will see her back in 4 weeks to see how she is doing overall.  She can stop her baby aspirin  twice daily.

## 2023-09-14 ENCOUNTER — Ambulatory Visit: Admitting: Psychiatry

## 2023-09-30 ENCOUNTER — Encounter: Payer: Self-pay | Admitting: Podiatry

## 2023-09-30 ENCOUNTER — Encounter: Payer: Self-pay | Admitting: Orthopaedic Surgery

## 2023-09-30 ENCOUNTER — Ambulatory Visit: Payer: Self-pay | Admitting: Podiatry

## 2023-09-30 ENCOUNTER — Ambulatory Visit (INDEPENDENT_AMBULATORY_CARE_PROVIDER_SITE_OTHER)

## 2023-09-30 ENCOUNTER — Ambulatory Visit (INDEPENDENT_AMBULATORY_CARE_PROVIDER_SITE_OTHER): Admitting: Orthopaedic Surgery

## 2023-09-30 DIAGNOSIS — M21611 Bunion of right foot: Secondary | ICD-10-CM

## 2023-09-30 DIAGNOSIS — M21612 Bunion of left foot: Secondary | ICD-10-CM | POA: Diagnosis not present

## 2023-09-30 DIAGNOSIS — M2012 Hallux valgus (acquired), left foot: Secondary | ICD-10-CM | POA: Diagnosis not present

## 2023-09-30 DIAGNOSIS — M2011 Hallux valgus (acquired), right foot: Secondary | ICD-10-CM | POA: Diagnosis not present

## 2023-09-30 DIAGNOSIS — M21619 Bunion of unspecified foot: Secondary | ICD-10-CM

## 2023-09-30 DIAGNOSIS — Z96642 Presence of left artificial hip joint: Secondary | ICD-10-CM

## 2023-09-30 NOTE — Progress Notes (Signed)
   Chief Complaint  Patient presents with   Foot Pain    Pt is here due to right foot pain to the great and 2nd toe, she states that this been going on for 2-3 months also concern with bilateral bunions, recently had left hip replaced.    HPI: 58 y.o. female presenting today as a new patient for evaluation of bunion and hammertoe deformity to the bilateral feet.  Chronic history of bunion development which is minimally symptomatic.  Occasionally she will have pain and tenderness associated to the second MTP of the foot but for most part it is asymptomatic  Past Medical History:  Diagnosis Date   Acne    Anxiety    Depression    Hematochezia    Hypertension    Osteoarthritis of left hip    Sleep apnea    uses CPAP   Takotsubo cardiomyopathy    a. NSTEMI 12/2017 -> cath with normal coronaries, preserved EF but apical ballooning suggestive of Takotsubo cardiomyopathy.    Past Surgical History:  Procedure Laterality Date   COLONOSCOPY  09/21/2014   Moderate predominantly sigmoid diverticulosis. Otherwise normal colonoscopy   CYSTECTOMY  2018   LEFT HEART CATH AND CORONARY ANGIOGRAPHY N/A 01/19/2018   Procedure: LEFT HEART CATH AND CORONARY ANGIOGRAPHY;  Surgeon: Anner Alm ORN, MD;  Location: [facility] INVASIVE CV LAB;  Service: Cardiovascular;  Laterality: N/A;   TOTAL HIP ARTHROPLASTY Left 08/18/2023   Procedure: ARTHROPLASTY, HIP, TOTAL, ANTERIOR APPROACH, LEFT;  Surgeon: Vernetta Lonni GRADE, MD;  Location: [facility] OR;  Service: Orthopedics;  Laterality: Left;    No Known Allergies   Physical Exam: General: The patient is alert and oriented x3 in no acute distress.  Dermatology: Skin is warm, dry and supple bilateral lower extremities.   Vascular: Palpable pedal pulses bilaterally. Capillary refill within normal limits.  No appreciable edema.  No erythema.  Neurological: Grossly intact via light touch  Musculoskeletal Exam: Hallux valgus deformity noted with mild hammertoe  contracture to the second digit bilateral.  There is no tenderness with palpation or range of motion.  Radiographic Exam B/L feet 09/30/2023:  Normal osseous mineralization. Joint spaces preserved.  Increased intermetatarsal angle with medial deviation of the first metatarsal head and increased hallux abductus angle.  Mild hammertoe contracture deformity also noted to the second digit bilateral  Assessment/Plan of Care: 1.  Moderate hallux valgus deformity bilateral; asymptomatic 2.  Mild hammertoe second digit bilateral  -Patient evaluated.  X-rays reviewed -Today we discussed surgical versus conservative management of the bunion and hammertoe deformities.  At the moment I recommend conservative management since it is minimally symptomatic.  She experiences majority of her pain and tenderness whenever she is around the house barefoot. -Recommend good supportive shoes and sneakers and recovery slides around the house -Return to clinic PRN       Thresa EMERSON Sar, DPM [facility]  Dr. Thresa EMERSON Sar, DPM    [address] [place], [place] [address]                Office [phone]  Fax [phone]

## 2023-09-30 NOTE — Progress Notes (Signed)
 The patient is a very active 58 year old female who is now 6 weeks status post a left total hip replacement.  She says she is doing very well overall and has good range of motion and strength.  She is walking without an assistive device.  She does have some start up pain when she first gets up but is able to walk that off.  There is to some slight stiffness with activities at first.  On exam today her right and left hips move smoothly and fluidly and are equal.  Her left operative hip incision looks good.  At this point follow-up can be in 6 months since she is doing so well.  If there are issues before then she knows to let us  know.  At the next visit we will have a standing AP pelvis and lateral of her left operative hip.

## 2023-10-30 ENCOUNTER — Encounter: Payer: Self-pay | Admitting: Physician Assistant

## 2023-10-30 ENCOUNTER — Ambulatory Visit: Admitting: Psychiatry

## 2023-10-30 ENCOUNTER — Ambulatory Visit: Admitting: Physician Assistant

## 2023-10-30 DIAGNOSIS — F411 Generalized anxiety disorder: Secondary | ICD-10-CM | POA: Diagnosis not present

## 2023-10-30 DIAGNOSIS — F418 Other specified anxiety disorders: Secondary | ICD-10-CM | POA: Diagnosis not present

## 2023-10-30 DIAGNOSIS — F3342 Major depressive disorder, recurrent, in full remission: Secondary | ICD-10-CM

## 2023-10-30 MED ORDER — TRAZODONE HCL 50 MG PO TABS
50.0000 mg | ORAL_TABLET | Freq: Every evening | ORAL | 3 refills | Status: AC | PRN
Start: 2023-10-30 — End: ?

## 2023-10-30 NOTE — Progress Notes (Signed)
 [facility] Counselor/Therapist Progress Note  Patient ID: Alexis Ibarra, MRN: [id number],    Date: 10/30/2023  Time Spent: 55 minutes   Treatment Type: Individual Therapy  Reported Symptoms: anxiety, second-guessing herself and worries about things that I shouldn't, Overthinking     Mental Status Exam:  Appearance:   Casual and Neat     Behavior:  Appropriate, Sharing, and Motivated  Motor:  Normal  Speech/Language:   Clear and Coherent  Affect:  anxious  Mood:  anxious  Thought process:  goal directed  Thought content:    WNL  Sensory/Perceptual disturbances:    WNL  Orientation:  oriented to person, place, time/date, situation, day of week, month of year, year, and stated date of Sept. 5, 2025  Attention:  Good  Concentration:  Good  Memory:  WNL  Fund of knowledge:   Good  Insight:    Good and Fair  Judgment:   Good  Impulse Control:  Good   Risk Assessment: Danger to Self:  No Self-injurious Behavior: No Danger to Others: No Duty to Warn:no Physical Aggression / Violence:No  Access to Firearms a concern: No  Gang Involvement:No   Subjective:  Patient today working further on her Worrying and Anxiety, along with family issues especially involving adult daughter and her fiance and 2 grandsons. Patient in session today processing her anxiety, frustration, mostly related to family issues and her relationships within the family. Having very hard time with boundaries at times within family. Over-worrying, making assumptions, discussed healthier boundaries.  Continue to work on strategies for better managing her anxiety and being able to set limits as needed within the family.  Encouraged patient to be more mindful and committed to her goals and really working on them outside of sessions in addition to inside of sessions.  Some self judging, judging of others, excessive worrying about family issues but difficulty following through on trying to change some of her thoughts  and behaviors within the family.  Encouraging more self-confidence.   Interventions: Cognitive Behavioral Therapy, Solution-Oriented/Positive Psychology, and Ego-Supportive Long term goal: Reduce overall level, frequency, and intensity of anxiety so that daily functioning is not impaired. Short term goal: Increase understanding of beliefs and messages that produce worry and anxiety. Strategy: 1.Explore cognitive messages that generate anxiety and retrain in adaptive cognitions. 2.Develop behavioral and cognitive strategies to reduce or eliminate irrational anxiety.   Diagnosis:   ICD-10-CM   1. Generalized anxiety disorder  F41.1      Plan:  Patient today very anxious and worried but hard to commit to making changes that could help her better manage the symptoms.  Wants the family situation to be better but hard to have an open mind about some of the needed changes.  Did get her to commit to doing some journaling which she agreed to do because one of her grandsons is journaling as directed by his therapist.  Review of calming thoughts and behaviors that patient can use during more stressful times, and also encouraged her to have some time out times for herself.  Continue to encourage patient and not making quick assumptions regarding certain issues and situations within the family.  Encouraged patient to be practicing more positive/self affirming behaviors as noted in sessions including: Believing more in herself and her ability to make changes in her thinking and behavior that will be positive for her, recognize and let go of negative/destructive thought patterns from the past that interrupts her moving forward, stay in the  present and focus on what she can control or change, remain in touch with people who are supportive, remain on her prescribed medications, be open to looking at issues in other ways that might be more helpful for her and provide some healing, refrain from assuming  worst-case scenarios, reduce overthinking and overanalyzing, consider that sometimes she does not fully understand what someone else is saying or maybe took it the wrong way and be able to check it out rather than assume the negatives, challenge her self-doubt, saying no without feeling guilty, continue her work on control issues, and recognize the strength she shows working with goal-directed behaviors to move in a direction that supports her overall improved emotional health and wellbeing.  Carmell Elgin has shown some progress and needs to continue her work with goal-directed behaviors in order to experience some movement in a healthier and more positive direction.  Goal review and progress/challenges noted with patient.  Next appointment within 3 weeks.   Barnie Bunde, LCSW

## 2023-10-30 NOTE — Progress Notes (Signed)
 [facility] Med Check  Patient ID: Alexis Ibarra,  MRN: [id number]  PCP: Montey Lot, PA-C  Date of Evaluation: 10/30/2023 Time spent:20 minutes  Chief Complaint:  Chief Complaint   Anxiety; Depression; Follow-up    HISTORY/CURRENT STATUS: For routine med check.  Doing well as far as her meds are concerned. Started taking the Trazodone  again.  Was having a hard time falling asleep. Better now with the Trazodone .  Patient is able to enjoy things.  Energy and motivation are good.  No extreme sadness, tearfulness, or feelings of hopelessness.  ADLs and personal hygiene are normal.   Denies any changes in concentration, making decisions, or remembering things.  Appetite has not changed.  Weight is stable.  Gets anxious when she has blood work, Ativan  is helpful at that time..  Has generalized anxiety though but not bad enough for the Ativan .  No mania, delirium, AH/VH.  No SI/HI.  Individual Medical History/ Review of Systems: Changes? :Yes    had hip replacement since LOV.  She is doing really well.  Past medications for mental health diagnoses include: Celexa, Wellbutrin , Lexapro, Ativan , Trazodone  caused constipation and made her 'feel weird.'  Allergies: Patient has no known allergies.  Current Medications:  Current Outpatient Medications:    aspirin  81 MG chewable tablet, Chew 1 tablet (81 mg total) by mouth 2 (two) times daily., Disp: 30 tablet, Rfl: 0   buPROPion  (WELLBUTRIN  XL) 300 MG 24 hr tablet, Take 1 tablet (300 mg total) by mouth daily., Disp: 90 tablet, Rfl: 3   busPIRone  (BUSPAR ) 15 MG tablet, Take 1 tablet (15 mg total) by mouth 3 (three) times daily., Disp: 270 tablet, Rfl: 3   carvedilol  (COREG ) 3.125 MG tablet, TAKE 1 TABLET(3.125 MG) BY MOUTH TWICE DAILY WITH A MEAL., Disp: 180 tablet, Rfl: 3   diphenhydramine -acetaminophen  (TYLENOL  PM) 25-500 MG TABS tablet, Take 2 tablets by mouth at bedtime as needed (sleep/pain)., Disp: , Rfl:    LORazepam  (ATIVAN ) 0.5 MG  tablet, Take 0.5-1 tablets (0.25-0.5 mg total) by mouth every 8 (eight) hours as needed for anxiety., Disp: 30 tablet, Rfl: 5   losartan  (COZAAR ) 25 MG tablet, TAKE 1/2 TABLET BY MOUTH DAILY, Disp: 45 tablet, Rfl: 3   MegaRed Omega-3 Krill Oil 500 MG CAPS, Take 500 mg by mouth daily., Disp: 30 capsule, Rfl: 11   Melatonin 10 MG TABS, Take 10 mg by mouth at bedtime as needed (sleep)., Disp: , Rfl:    metroNIDAZOLE (METROGEL) 1 % gel, Apply 1 Application topically daily., Disp: , Rfl:    nitroGLYCERIN  (NITROSTAT ) 0.4 MG SL tablet, Place 1 tablet (0.4 mg total) under the tongue every 5 (five) minutes as needed for chest pain (up to 3 doses)., Disp: 25 tablet, Rfl: 6   OVER THE COUNTER MEDICATION, Take 2 capsules by mouth daily. Provitalize Supplement, Disp: , Rfl:    rosuvastatin  (CRESTOR ) 5 MG tablet, TAKE 1 TABLET BY MOUTH EVERY DAY IN THE EVENING, Disp: 90 tablet, Rfl: 3   venlafaxine  (EFFEXOR ) 100 MG tablet, 2 po q am, 1 qhs, Disp: 270 tablet, Rfl: 3   bisacodyl ([employer] C-LAX LAXATIVE) 5 MG EC tablet, Take 5 mg by mouth daily as needed for moderate constipation., Disp: , Rfl:    traZODone  (DESYREL ) 50 MG tablet, Take 1 tablet (50 mg total) by mouth at bedtime as needed., Disp: 90 tablet, Rfl: 3 Medication Side Effects: none  Family Medical/ Social History: Changes? No  MENTAL HEALTH EXAM:  There were no vitals taken for this  visit.There is no height or weight on file to calculate BMI.  General Appearance: Casual, Neat and Well Groomed  Eye Contact:  Good  Speech:  Clear and Coherent and Normal Rate  Volume:  Normal  Mood:  Euthymic  Affect:  Congruent  Thought Process:  Goal Directed and Descriptions of Associations: Circumstantial  Orientation:  Full (Time, Place, and Person)  Thought Content: Logical   Suicidal Thoughts:  No  Homicidal Thoughts:  No  Memory:  WNL  Judgement:  Good  Insight:  Good  Psychomotor Activity:  Normal  Concentration:  Concentration: Good and Attention Span:  Good  Recall:  Good  Fund of Knowledge: Good  Language: Good  Assets:  Communication Skills Desire for Improvement Financial Resources/Insurance Housing Resilience Transportation Vocational/Educational  ADL's:  Intact  Cognition: WNL  Prognosis:  Good   DIAGNOSES:    ICD-10-CM   1. Generalized anxiety disorder  F41.1     2. Recurrent major depression in full remission (HCC)  F33.42     3. Situational anxiety  F41.8       Receiving Psychotherapy:  Yes with Marval Bunde, LCSW  RECOMMENDATIONS: PDMP was reviewed.  Gabapentin filled 07/29/2023.  Ativan  filled 06/29/2023.  Testosterone filled 10/30/2022.   I provided approximately 20 minutes of face to face time during this encounter, including time spent before and after the visit in records review, medical decision making, counseling pertinent to today's visit, and charting.   Cathren is doing well so no changes need to be made.  Continue Wellbutrin  XL 300 mg, 1 p.o. every morning. Continue Buspar  15 mg, 1 po bid. Continue Ativan  0.5 mg, 1-2 every 8 hours as needed anxiety.  (Okay to take up to 4 before she has blood drawn as long as she has a driver.)  Continue Effexor  100 mg, 2 p.o. every morning and 1 p.o. nightly. Continue multivitamin, omega red, vitamin D, and B complex daily. Continue therapy with Marval Bunde, LCSW.  Return in     Ryan, PA-C

## 2023-11-14 ENCOUNTER — Other Ambulatory Visit: Payer: Self-pay | Admitting: Physician Assistant

## 2023-11-18 ENCOUNTER — Ambulatory Visit: Admitting: Psychiatry

## 2023-11-18 DIAGNOSIS — F411 Generalized anxiety disorder: Secondary | ICD-10-CM

## 2023-11-18 NOTE — Progress Notes (Signed)
 [facility] Counselor/Therapist Progress Note  Patient ID: Alexis Ibarra, MRN: [id number],    Date: 11/18/2023  Time Spent: 53 minutes   Treatment Type: Individual Therapy  Reported Symptoms: anxiety, some depression but more stressed, but some things better til Sunday, overthinking, worrying but I feel like I'm making progress   Mental Status Exam:  Appearance:   Casual     Behavior:  Appropriate, Sharing, and Motivated  Motor:  Normal  Speech/Language:   Clear and Coherent  Affect:  Depressed and anxious, stressed  Mood:  anxious and stressed  Thought process:  goal directed  Thought content:    Rumination  Sensory/Perceptual disturbances:    WNL  Orientation:  oriented to person, place, time/date, situation, day of week, month of year, year, and stated date of Sept. 24, 2025  Attention:  Fair  Concentration:  Good and Fair  Memory:  WNL  Fund of knowledge:   Good  Insight:    Good and Fair  Judgment:   Good  Impulse Control:  Good   Risk Assessment: Danger to Self:  No Self-injurious Behavior: No Danger to Others: No Duty to Warn:no Physical Aggression / Violence:No  Access to Firearms a concern: No  Gang Involvement:No   Subjective:  Patient today working more on her anxiety, worrying and doing better til 2 or 3 days ago when I got call from daughter's BF but was not told the truth. Today needing to share volatile situation involving her adult daughter and her BF. Was told info that was not true and processed this some today as there have been more difficult circumstances occurring within the family and with her adult daughter and BF. Patient today concerned but makes sure the kids are protected and they are often with her. Frustrated with her daughter and worked today further on some ways she might be more helpful to daughter and setting better boundaries. Also focusing on her grandsons and how she can best support them, particular in some of her responses about  sensitive issues. (Not all details included in this note due to privacy needs.) To work further on her over-worrying and seeing her strengths, while dealing with difficult circumstances. Trying not to let herself be manipulated so easily. Strengthening her boundaries.  Working hard to say no when she needs to say no.  Continues working on decreasing her excessive worrying about family issues especially when she cannot control, self judging, judging other people, and to work more on trying to change some of her thoughts and behaviors as discussed in session today.  Some improvement in self-confidence noted today.    Interventions: Cognitive Behavioral Therapy, Solution-Oriented/Positive Psychology, and Ego-Supportive Long term goal: Reduce overall level, frequency, and intensity of anxiety so that daily functioning is not impaired. Short term goal: Increase understanding of beliefs and messages that produce worry and anxiety. Strategy: 1.Explore cognitive messages that generate anxiety and retrain in adaptive cognitions. 2.Develop behavioral and cognitive strategies to reduce or eliminate irrational anxiety.   Diagnosis:   ICD-10-CM   1. Generalized anxiety disorder  F41.1      Plan: Patient in session today and continues to work on better management of personal and family situations especially around the issues of anxiety, worry, some depression but more stressed.  She engaged more today than on some previous occasions which I encouraged her on and it seemed to help her to feel more confident within herself although is still working on that issue.  Wants to be  able to control more of the family issues that they are confronting but realizing some more that she cannot do but so much and is not able to control others nor is she responsible for their behaviors.  Anxious and worried but trying to be helpful within the family however possible.  Working further on calming thoughts and behaviors that  are helpful to her when she is feeling more stressed and needing some time out for herself.  We continue to work on her thinking before acting, and not basing her thoughts and opinions on assumptions that are often quickly made without knowing a lot of information, especially within family situations and issues.  (Not all details included in this note due to patient privacy needs). Encouraged patient in her practice of more positive/self affirming behaviors as noted in sessions including: Believe more in herself and her ability to make additional changes in her thinking and behavior that will be positive for her, recognize and let go of negative/distracted thought patterns from the past that interrupt her moving forward now,  stay in the present and focus on what she can control or change, remain in touch with people who are supportive, stay on her prescribed medications, be open to looking at issues and other ways that might be more helpful for her and provide some healing, refrain from assuming worst-case scenarios, reduce overthinking and over analyzing, consider that sometimes she does not fully understand what someone else is saying or maybe to get the wrong way and be able to check it out with them rather than assume something negative, challenge her self-doubt, saying no without feeling guilty, continue her work on control issues, recognize the strengths she shows working with goal-directed behaviors to keep moving in a direction that supports her overall improved emotional health and wellbeing.  Evangelene Vora has shown some progress and needs to continue her work with goal-directed behaviors to experience more movement in a healthier and positive direction.  Goal review and progress/challenges noted with patient.  Next appointment within 4 weeks.  Barnie Bunde, LCSW

## 2023-12-16 ENCOUNTER — Ambulatory Visit: Admitting: Psychiatry

## 2023-12-16 DIAGNOSIS — F411 Generalized anxiety disorder: Secondary | ICD-10-CM | POA: Diagnosis not present

## 2023-12-16 NOTE — Progress Notes (Signed)
 [facility] Counselor/Therapist Progress Note  Patient ID: Alexis Ibarra, MRN: [id number],    Date: 12/16/2023  Time Spent: 53 minutes   Treatment Type: Individual Therapy  Reported Symptoms: anxiety, depression, overthinking, worrying and improving some   Mental Status Exam:  Appearance:   Casual and Neat     Behavior:  Appropriate, Sharing, and Motivated  Motor:  Normal  Speech/Language:   Clear and Coherent  Affect:  Depressed and anxious  Mood:  anxious and depressed  Thought process:  goal directed  Thought content:    Rumination  Sensory/Perceptual disturbances:    WNL  Orientation:  oriented to person, place, time/date, situation, day of week, month of year, year, and stated date of Oct. 22, 2025  Attention:  Good  Concentration:  Good  Memory:  WNL  Fund of knowledge:   Good  Insight:    Good and Fair  Judgment:   Good  Impulse Control:  Good   Risk Assessment: Danger to Self:  No Self-injurious Behavior: No Danger to Others: No Duty to Warn:no Physical Aggression / Violence:No  Access to Firearms a concern: No  Gang Involvement:No   Subjective:  Patient today in session and reports that her anxiety and worrying might be some better as she is trying more to realize that excessive worrying does not help anybody nor anything. Trying to set healthier boundaries between patient and adult daughter. Patient concerned about family/extended family issues as discussed in session today. Situations within family continue to create stress and anxiety. Frustrated with daughter and discussed this further today. (Not all details included in this note due to patient privacy needs.) Trying to best support grandsons in a volatile situation. Very boundary-conscious. Main focus is to support grandchildren. Continue work on [employer], better boundaries, stress management, being more positive with herself.  Is becoming better able to see some of her strengths more.  Tries not  to let herself be so easily manipulated by others, focus on healthier boundaries that need to be consistent.  Is getting some better at saying no when I need to say no.  Trying not to judge herself so partially and decreasing her worry about family issues that are out of her control.  Her self-confidence is definitely improving gradually.    Interventions: Cognitive Behavioral Therapy, Solution-Oriented/Positive Psychology, and Ego-Supportive Long term goal: Reduce overall level, frequency, and intensity of anxiety so that daily functioning is not impaired. Short term goal: Increase understanding of beliefs and messages that produce worry and anxiety. Strategy: 1.Explore cognitive messages that generate anxiety and retrain in adaptive cognitions. 2.Develop behavioral and cognitive strategies to reduce or eliminate irrational anxiety.   Diagnosis:   ICD-10-CM   1. Generalized anxiety disorder  F41.1      Plan:   Patient today working in session and continues to focus on family and personal situations connected with her anxiety, excessive worry, some depression, boundaries and feeling stressed.  She reports today that she feels she has made some progress but because of family dynamics and situations that continue to sometimes be unpredictable, she feels that has influenced her work on her symptoms, but still feels she is improving some and feels good about that.  Very well engaged in session today and worked from start to finish, staying very much on task with current situations in her personal and family life where most of her symptoms play a role.  Does seem to be gaining a little more self-confidence.  Difficult to let  go of any control, and we are working on that currently.  Also trying to be motivated to work further on her worrying.  Wants to think before she acts, and not base her thoughts and opinions on assumptions or what others tell her as she feels she sometimes gets an accurate  information from others within the family at times.  (Not all details included in this note due to patient privacy needs).  Continue to encourage patient to be practicing more positive/self affirming behaviors as we note in sessions including: Recognize and let go of negative/distracted thought patterns from the past that interrupt her from moving forward now, believe more in herself and her ability to make additional changes in her thinking and behavior that will be positive for her, stay in the present and focus on what she can control or change, remain in touch with people who are supportive, stay on her prescribed medications, be open to looking at issues in other ways that might be more helpful for her and provide some healing, refrain from assuming worst-case scenarios, reduce overthinking and overanalyzing, consider that sometimes she does not fully understand what someone else is saying or maybe took it the wrong way and be able to check it out with the other person rather than assume something negative, challenge herself about, saying no without feeling guilty, continue her work on control issues, recognize the strength she shows working with goal-directed behaviors to keep moving in a direction that supports her overall improved emotional health and wellbeing.  Alexis Ibarra has made progress and needs to continue her work with goal-directed behaviors so as to experience more movement forward in a healthier and more positive direction.  Goal review and progress/challenges noted with patient.  Next appointment within 4 weeks.   Barnie Bunde, LCSW

## 2023-12-28 ENCOUNTER — Encounter: Payer: Self-pay | Admitting: Radiology

## 2024-01-08 ENCOUNTER — Other Ambulatory Visit: Payer: Self-pay | Admitting: Physician Assistant

## 2024-01-13 ENCOUNTER — Ambulatory Visit: Admitting: Psychiatry

## 2024-02-11 ENCOUNTER — Ambulatory Visit: Admitting: Psychiatry

## 2024-02-11 DIAGNOSIS — F411 Generalized anxiety disorder: Secondary | ICD-10-CM | POA: Diagnosis not present

## 2024-02-11 NOTE — Progress Notes (Signed)
 [facility] Counselor/Therapist Progress Note  Patient ID: Alexis Ibarra, MRN: [id number],    Date: 02/11/2024  Time Spent: 55 minutes   Treatment Type: Individual Therapy  Reported Symptoms:  anxiety, overthinking, overworrying, not so much depression    Mental Status Exam:  Appearance:   Neat     Behavior:  Appropriate, Sharing, and Motivated  Motor:  Normal  Speech/Language:   Clear and Coherent  Affect:  anxious  Mood:  anxious  Thought process:  goal directed  Thought content:    Rumination  Sensory/Perceptual disturbances:    WNL  Orientation:  oriented to person, place, time/date, situation, day of week, month of year, year, and stated date of Dec. 18, 2025  Attention:  Good  Concentration:  Good  Memory:  WNL  Fund of knowledge:   Good  Insight:    Good and Fair  Judgment:   Good  Impulse Control:  Good   Risk Assessment: Danger to Self:  No Self-injurious Behavior: No Danger to Others: No Duty to Warn:no Physical Aggression / Violence:No  Access to Firearms a concern: No  Gang Involvement:No   Subjective:  Patient in session today reporting anxiety and worry mostly related to family issues, and hard to set limits. Trying to work through and let go more of some family issues as processed more today and involving her previous husband primarily. Working on her anxiety/worrying and trying to talk through more of her concerns today and also realizing what I can change and what I can't  change and processed this more in session today which seemed helpful for patient as she is wanting to have healthy boundaries. Not wanting to hurt the situation/family but willing to help at times and does want ex-husband to be ok, and for daughter to continue to have contact with him.  (Not all details included in this note due to patient privacy needs.) patient continues her efforts to decrease her over-worrying, exercise better stress management, use better boundaries, and  trying to be more open and positive about herself.  Does try to guard against letting herself be easily manipulated by others, trying to see more her strengths, focusing on healthier boundaries and keeping the more consistent.  Continues to work on saying no when I need to say no, and being able to walk away from negative situations when needed.  Self-confidence seems to be improving along with the ability to try and see some things from a different angle than the way she has been looking at them.    Interventions: Cognitive Behavioral Therapy, Solution-Oriented/Positive Psychology, and Ego-Supportive Long term goal: Reduce overall level, frequency, and intensity of anxiety so that daily functioning is not impaired. Short term goal: Increase understanding of beliefs and messages that produce worry and anxiety. Strategy: 1.Explore cognitive messages that generate anxiety and retrain in adaptive cognitions. 2.Develop behavioral and cognitive strategies to reduce or eliminate irrational anxiety.   Diagnosis:   ICD-10-CM   1. Generalized anxiety disorder  F41.1      Plan:  Patient today in session working further on personal and family situations that relate to her anxiety, some depression, excessive worry, healthier boundaries, and feeling stressed.  Noticing some of her progress as well as continued challenges.  Worked well in session today and talked freely about things she wanted to work further on and change, acknowledging the things that she cannot change and others.  Trying to think more before she acts.  Encouraged her in working to  worry less and not jump ahead and make assumptions about what others may think as this has not been helpful for patient in the past. Encouraged patient to be practicing positive/self affirming behaviors as we note in sessions including: Believing more in herself and her ability to make additional changes in her thinking and behavior that will be positive for  her, stay in the present and focus on what she can control or change, recognize and let go of negative/distracted thought patterns from the past that interrupt her from moving forward now, stay in touch with people who are supportive, remain on her prescribed medications, be open to looking at issues in other ways that might be more helpful for her and provide some healing, refrain from assuming worst-case scenarios, reduce overthinking and overanalyzing, consider that sometimes she does not fully understand what someone else is saying or maybe took it the wrong way and be able to check it out with other person rather than assume something negative, saying no without feeling guilty, continue her work on control issues, recognize the strengths she shows working with goal-directed behaviors to keep moving in a direction that supports her overall improved emotional health and wellbeing.  Alexis Ibarra has made some progress and needs to continue working with goal-directed behaviors to experience more progress on her goals and help move her in a healthier and more positive direction.  Goal review and progress/challenges noted with patient.  Next appointment within 4 weeks.   Barnie Bunde, LCSW

## 2024-02-22 ENCOUNTER — Other Ambulatory Visit: Payer: Self-pay

## 2024-02-22 MED ORDER — LOSARTAN POTASSIUM 25 MG PO TABS: 12.5000 mg | ORAL_TABLET | Freq: Every day | ORAL | 3 refills | Status: AC

## 2024-03-03 NOTE — Progress Notes (Signed)
 " Cardiology Office Note:    Date:  03/04/2024   ID:  Alexis Ibarra, DOB Nov 01, 1965, MRN [id number]  PCP:  Montey Lot, PA-C  Cardiologist:  Aleene Passe, MD (Inactive)  Electrophysiologist:  None   Referring MD: Montey Lot, PA-C   Chief Complaint  Patient presents with   Takotsubo cardiomyopathy   Follow-up  Takotsubo Syndrome  Previous notes:   Alexis Ibarra is a 59 y.o. female with a hx of Takotsubo syndrome. In 2019. Cath showed 40% PLB only  New to me previously followed by [name]. She recovered normally. Retired Use to work at [employer]  She has been Rx with ASA 81 mg, coreg  3.125 mg bid and losartan  25 mg daily. She has some panic attacks/GAD uses desyrel , effexor  and buspar  as will as PRN Ativan  She is on statin for her cholesterol  TTE 2019 EF 60-65% trivial AR  She pretty much started having panic attack while I was in room She sees a therapist but was concerned that I was going to draw blood. She has a script for Ativan  to take prior to any blood work but has not had any in quite a while.   She has no dyspnea, syncope, chest pain or dyspnea    Past Medical History:  Diagnosis Date   Acne    Anxiety    Depression    Hematochezia    Hypertension    Osteoarthritis of left hip    Sleep apnea    uses CPAP   Takotsubo cardiomyopathy    a. NSTEMI 12/2017 -> cath with normal coronaries, preserved EF but apical ballooning suggestive of Takotsubo cardiomyopathy.    Past Surgical History:  Procedure Laterality Date   COLONOSCOPY  09/21/2014   Moderate predominantly sigmoid diverticulosis. Otherwise normal colonoscopy   CYSTECTOMY  2018   LEFT HEART CATH AND CORONARY ANGIOGRAPHY N/A 01/19/2018   Procedure: LEFT HEART CATH AND CORONARY ANGIOGRAPHY;  Surgeon: Anner Alm ORN, MD;  Location: [facility] INVASIVE CV LAB;  Service: Cardiovascular;  Laterality: N/A;   TOTAL HIP ARTHROPLASTY Left 08/18/2023   Procedure: ARTHROPLASTY, HIP, TOTAL, ANTERIOR APPROACH, LEFT;   Surgeon: Vernetta Lonni GRADE, MD;  Location: [facility] OR;  Service: Orthopedics;  Laterality: Left;    Current Medications: Current Meds  Medication Sig   aspirin  81 MG chewable tablet Chew 1 tablet (81 mg total) by mouth 2 (two) times daily.   bisacodyl ([employer] C-LAX LAXATIVE) 5 MG EC tablet Take 5 mg by mouth daily as needed for moderate constipation.   buPROPion  (WELLBUTRIN  XL) 300 MG 24 hr tablet Take 1 tablet (300 mg total) by mouth daily.   busPIRone  (BUSPAR ) 15 MG tablet Take 1 tablet (15 mg total) by mouth 2 (two) times daily.   carvedilol  (COREG ) 3.125 MG tablet TAKE 1 TABLET(3.125 MG) BY MOUTH TWICE DAILY WITH A MEAL.   diphenhydramine -acetaminophen  (TYLENOL  PM) 25-500 MG TABS tablet Take 2 tablets by mouth at bedtime as needed (sleep/pain).   LORazepam  (ATIVAN ) 0.5 MG tablet Take 0.5-1 tablets (0.25-0.5 mg total) by mouth every 8 (eight) hours as needed for anxiety.   losartan  (COZAAR ) 25 MG tablet Take 0.5 tablets (12.5 mg total) by mouth daily.   MegaRed Omega-3 Krill Oil 500 MG CAPS Take 500 mg by mouth daily.   Melatonin 10 MG TABS Take 10 mg by mouth at bedtime as needed (sleep).   metroNIDAZOLE (METROGEL) 1 % gel Apply 1 Application topically daily.   nitroGLYCERIN  (NITROSTAT ) 0.4 MG SL tablet Place 1 tablet (0.4  mg total) under the tongue every 5 (five) minutes as needed for chest pain (up to 3 doses).   ORACEA  40 MG CPDR Take 1 capsule by mouth daily.   OVER THE COUNTER MEDICATION Take 2 capsules by mouth daily. Provitalize Supplement   rosuvastatin  (CRESTOR ) 5 MG tablet TAKE 1 TABLET BY MOUTH EVERY DAY IN THE EVENING   traZODone  (DESYREL ) 50 MG tablet Take 1 tablet (50 mg total) by mouth at bedtime as needed.   venlafaxine  (EFFEXOR ) 100 MG tablet 2 po q am, 1 qhs     Allergies:   Patient has no known allergies.   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  4 years.     Has a 60 yo dtr from previous marriage. 2 grandsons.  2 stepdtrs.   Grew up with both parents in the home.  They've been married 54 years.  Has a younger brother.    Grew up in [place], [place] and still lives there.    Went to [employer].  Plans to retire in Oct. And will help out family.      Christian      No legal issues       Caffeine 1-2 coffee/day.    Social Drivers of Health   Tobacco Use: Low Risk (03/04/2024)   Patient History    Smoking Tobacco Use: Never    Smokeless Tobacco Use: Never    Passive Exposure: Not on file  Financial Resource Strain: Not on file  Food Insecurity: Not on file  Transportation Needs: Not on file  Physical Activity: Not on file  Stress: Not on file  Social Connections: Not on file  Depression (EYV7-0): Not on file  Alcohol Screen: Not on file  Housing: Not on file  Utilities: Not on file  Health Literacy: Not on file     Family History: The patient's family history includes CAD in her maternal grandfather; Cirrhosis in her father; Colon polyps in her father; Colonic polyp in her father; Dementia in her maternal grandmother and paternal grandmother; Depression in her daughter; Healthy in her brother; Heart failure in her paternal grandfather; Hypertension in her father; Hypothyroidism in her mother; Kidney disease in her father; Liver cancer in her father; Prostate cancer in her paternal grandfather. There is no history of Colon cancer, Pancreatic  cancer, Rectal cancer, Stomach cancer, or Esophageal cancer.  ROS:   Please see the history of present illness.     All other systems reviewed and are negative.  EKGs/Labs/Other Studies Reviewed:    03/04/2024 SR rate 90 LAD otherwise normal    Recent Labs: 08/19/2023: BUN 12; Creatinine, Ser 0.79; Hemoglobin 10.4; Platelets 284; Potassium 4.1; Sodium 135  Recent Lipid Panel    Component Value Date/Time   CHOL 144 12/07/2018 0947   TRIG  57 12/07/2018 0947   HDL 63 12/07/2018 0947   CHOLHDL 2.3 12/07/2018 0947   CHOLHDL 2.8 01/19/2018 1047   VLDL 8 01/19/2018 1047   LDLCALC 69 12/07/2018 0947   Physical Exam: Blood pressure (!) 149/89, pulse 89, resp. rate 17, height 5' 2 (1.575 m), weight 138 lb (62.6 kg), SpO2 98%.   GEN:  Well nourished, well developed in no acute distress HEENT: Normal NECK: No JVD; No carotid bruits LYMPHATICS: No lymphadenopathy CARDIAC: RRR , no murmurs, rubs, gallops RESPIRATORY:  Clear to auscultation without rales, wheezing or rhonchi  ABDOMEN: Soft, non-tender, non-distended MUSCULOSKELETAL:  No edema; No deformity  SKIN: Warm and dry NEUROLOGIC:  Alert and oriented x 3   EKG:     SR LAD rate 91 otherwise normal     ASSESSMENT:    Takatsuob DCM   PLAN:     1.  Takotsubo syndrome:    She is doing very well.  She has not had any recurrent symptoms of Takotsubo syndrome.  Continue carvedilol  and losartan  she has mild CAD.  Update echo since she has not had one in 7 years   2.  Mild coronary artery disease:   her last LDL was 70.  Cont current medication s Continue statin LDL at goal  3.  Anxiety:  . Follow up with her primary MD On multi-drug Rx Will get blood work at Dr [facility] as it is closer to her home and her mom can drive her there after she takes Ativan   4. HTN:  continue coreg  and cozaar  She has significant white coat She will take BP at home and take 25 mg Cozaar  if needed     Medication Adjustments/Labs and Tests  Ordered: Current medicines are reviewed at length with the patient today.  Concerns regarding medicines are outlined above.  Orders Placed This Encounter  Procedures   EKG 12-Lead   No orders of the defined types were placed in this encounter.  We will have her see us  again in 1 year.  There are no Patient Instructions on file for this visit.   Signed, Maude Emmer, MD  03/04/2024 9:11 AM    [facility]  "

## 2024-03-04 ENCOUNTER — Ambulatory Visit: Attending: Cardiovascular Disease | Admitting: Cardiovascular Disease

## 2024-03-04 ENCOUNTER — Encounter: Payer: Self-pay | Admitting: Cardiovascular Disease

## 2024-03-04 VITALS — BP 149/89 | HR 89 | Resp 17 | Ht 62.0 in | Wt 138.0 lb

## 2024-03-04 DIAGNOSIS — I5181 Takotsubo syndrome: Secondary | ICD-10-CM

## 2024-03-04 NOTE — Patient Instructions (Signed)
 Medication Instructions:  Your physician recommends that you continue on your current medications as directed. Please refer to the Current Medication list given to you today.  *If you need a refill on your cardiac medications before your next appointment, please call your pharmacy*   Follow-Up: At [facility], you and your health needs are our priority.  As part of our continuing mission to provide you with exceptional heart care, our providers are all part of one team.  This team includes your primary Cardiologist (physician) and Advanced Practice Providers or APPs (Physician Assistants and Nurse Practitioners) who all work together to provide you with the care you need, when you need it.  Your next appointment:   1 year(s)  Provider:   Maude Emmer, MD    We recommend signing up for the patient portal called MyChart.  Sign up information is provided on this After Visit Summary.  MyChart is used to connect with patients for Virtual Visits (Telemedicine).  Patients are able to view lab/test results, encounter notes, upcoming appointments, etc.  Non-urgent messages can be sent to your provider as well.   To learn more about what you can do with MyChart, go to [email or web address].   Other Instructions Monitor your blood pressure and bring readings to your next appointment

## 2024-03-09 ENCOUNTER — Other Ambulatory Visit: Payer: Self-pay | Admitting: Physician Assistant

## 2024-03-15 ENCOUNTER — Ambulatory Visit: Admitting: Psychiatry

## 2024-03-17 ENCOUNTER — Telehealth: Payer: Self-pay | Admitting: Cardiovascular Disease

## 2024-03-17 NOTE — Telephone Encounter (Signed)
" °*  STAT* If patient is at the pharmacy, call can be transferred to refill team.   1. Which medications need to be refilled? (please list name of each medication and dose if known)   rosuvastatin  (CRESTO     2. Would you like to learn more about the convenience, safety, & potential cost savings by using the [facility]?    3. Are you open to using the [facility] (Type [facility].    4. Which pharmacy/location (including street and city if local pharmacy) is medication to be sent to? [id number] - [place], [place] - [address] [facility]     5. Do they need a 30 day or 90 day supply? 90    "

## 2024-03-18 ENCOUNTER — Other Ambulatory Visit: Payer: Self-pay

## 2024-03-22 MED ORDER — ROSUVASTATIN CALCIUM 5 MG PO TABS: ORAL_TABLET | ORAL | 3 refills | Status: AC

## 2024-03-22 NOTE — Telephone Encounter (Signed)
 Pt is calling to f/u on her medication refill request from 03/17/24. Please advise

## 2024-04-04 ENCOUNTER — Ambulatory Visit: Admitting: Orthopaedic Surgery

## 2024-04-28 ENCOUNTER — Ambulatory Visit: Admitting: Physician Assistant

## 2024-04-28 ENCOUNTER — Ambulatory Visit: Admitting: Psychiatry
